# Patient Record
Sex: Female | Born: 1980 | Race: White | Hispanic: No | Marital: Single | Smoking: Current every day smoker
Health system: Southern US, Community
[De-identification: ages and names within clinical notes are randomized; demographics above are authoritative.]

---

## 2000-08-10 ENCOUNTER — Other Ambulatory Visit: Admission: RE | Admit: 2000-08-10 | Discharge: 2000-08-10 | Payer: Self-pay | Admitting: Obstetrics & Gynecology

## 2000-12-20 ENCOUNTER — Ambulatory Visit (HOSPITAL_COMMUNITY): Admission: RE | Admit: 2000-12-20 | Discharge: 2000-12-20 | Payer: Self-pay | Admitting: Obstetrics and Gynecology

## 2001-02-06 ENCOUNTER — Ambulatory Visit (HOSPITAL_COMMUNITY): Admission: RE | Admit: 2001-02-06 | Discharge: 2001-02-06 | Payer: Self-pay | Admitting: Obstetrics and Gynecology

## 2001-02-06 ENCOUNTER — Encounter: Payer: Self-pay | Admitting: Obstetrics and Gynecology

## 2001-02-07 ENCOUNTER — Encounter (INDEPENDENT_AMBULATORY_CARE_PROVIDER_SITE_OTHER): Payer: Self-pay | Admitting: Specialist

## 2001-02-07 ENCOUNTER — Inpatient Hospital Stay (HOSPITAL_COMMUNITY): Admission: AD | Admit: 2001-02-07 | Discharge: 2001-02-10 | Payer: Self-pay | Admitting: Obstetrics and Gynecology

## 2001-02-11 ENCOUNTER — Encounter: Admission: RE | Admit: 2001-02-11 | Discharge: 2001-03-13 | Payer: Self-pay | Admitting: Obstetrics and Gynecology

## 2001-03-14 ENCOUNTER — Encounter: Admission: RE | Admit: 2001-03-14 | Discharge: 2001-04-13 | Payer: Self-pay | Admitting: Obstetrics and Gynecology

## 2002-03-18 ENCOUNTER — Encounter: Payer: Self-pay | Admitting: Obstetrics & Gynecology

## 2002-03-18 ENCOUNTER — Inpatient Hospital Stay (HOSPITAL_COMMUNITY): Admission: AD | Admit: 2002-03-18 | Discharge: 2002-03-18 | Payer: Self-pay | Admitting: Obstetrics & Gynecology

## 2002-07-05 ENCOUNTER — Emergency Department (HOSPITAL_COMMUNITY): Admission: EM | Admit: 2002-07-05 | Discharge: 2002-07-05 | Payer: Self-pay | Admitting: Emergency Medicine

## 2002-07-05 ENCOUNTER — Encounter: Payer: Self-pay | Admitting: Emergency Medicine

## 2003-03-30 ENCOUNTER — Inpatient Hospital Stay (HOSPITAL_COMMUNITY): Admission: AD | Admit: 2003-03-30 | Discharge: 2003-03-30 | Payer: Self-pay | Admitting: Obstetrics and Gynecology

## 2003-09-06 ENCOUNTER — Inpatient Hospital Stay (HOSPITAL_COMMUNITY): Admission: AD | Admit: 2003-09-06 | Discharge: 2003-09-06 | Payer: Self-pay | Admitting: Family Medicine

## 2003-09-08 ENCOUNTER — Inpatient Hospital Stay (HOSPITAL_COMMUNITY): Admission: AD | Admit: 2003-09-08 | Discharge: 2003-09-08 | Payer: Self-pay | Admitting: Obstetrics & Gynecology

## 2003-09-15 ENCOUNTER — Inpatient Hospital Stay (HOSPITAL_COMMUNITY): Admission: AD | Admit: 2003-09-15 | Discharge: 2003-09-15 | Payer: Self-pay | Admitting: *Deleted

## 2004-01-10 ENCOUNTER — Emergency Department (HOSPITAL_COMMUNITY): Admission: EM | Admit: 2004-01-10 | Discharge: 2004-01-10 | Payer: Self-pay | Admitting: Emergency Medicine

## 2004-07-12 ENCOUNTER — Emergency Department (HOSPITAL_COMMUNITY): Admission: EM | Admit: 2004-07-12 | Discharge: 2004-07-12 | Payer: Self-pay | Admitting: Emergency Medicine

## 2004-12-11 ENCOUNTER — Inpatient Hospital Stay (HOSPITAL_COMMUNITY): Admission: AD | Admit: 2004-12-11 | Discharge: 2004-12-11 | Payer: Self-pay | Admitting: *Deleted

## 2004-12-21 ENCOUNTER — Inpatient Hospital Stay (HOSPITAL_COMMUNITY): Admission: AD | Admit: 2004-12-21 | Discharge: 2004-12-21 | Payer: Self-pay | Admitting: *Deleted

## 2005-01-12 ENCOUNTER — Ambulatory Visit: Payer: Self-pay | Admitting: Family Medicine

## 2005-02-03 ENCOUNTER — Ambulatory Visit: Payer: Self-pay | Admitting: Internal Medicine

## 2005-02-09 ENCOUNTER — Ambulatory Visit: Payer: Self-pay | Admitting: *Deleted

## 2005-02-10 ENCOUNTER — Ambulatory Visit: Payer: Self-pay | Admitting: *Deleted

## 2005-02-21 ENCOUNTER — Ambulatory Visit: Payer: Self-pay

## 2005-03-02 ENCOUNTER — Ambulatory Visit: Payer: Self-pay | Admitting: Family Medicine

## 2005-03-02 ENCOUNTER — Ambulatory Visit (HOSPITAL_COMMUNITY): Admission: RE | Admit: 2005-03-02 | Discharge: 2005-03-02 | Payer: Self-pay | Admitting: Orthopaedic Surgery

## 2005-03-08 ENCOUNTER — Ambulatory Visit: Payer: Self-pay | Admitting: *Deleted

## 2005-03-22 ENCOUNTER — Ambulatory Visit: Payer: Self-pay | Admitting: Obstetrics & Gynecology

## 2005-04-05 ENCOUNTER — Ambulatory Visit: Payer: Self-pay | Admitting: *Deleted

## 2005-04-19 ENCOUNTER — Ambulatory Visit: Payer: Self-pay | Admitting: Obstetrics & Gynecology

## 2005-05-03 ENCOUNTER — Ambulatory Visit: Payer: Self-pay | Admitting: Obstetrics & Gynecology

## 2005-05-10 ENCOUNTER — Ambulatory Visit: Payer: Self-pay | Admitting: Obstetrics & Gynecology

## 2005-05-10 ENCOUNTER — Inpatient Hospital Stay (HOSPITAL_COMMUNITY): Admission: AD | Admit: 2005-05-10 | Discharge: 2005-05-10 | Payer: Self-pay | Admitting: Obstetrics and Gynecology

## 2005-05-11 ENCOUNTER — Ambulatory Visit: Payer: Self-pay | Admitting: Obstetrics & Gynecology

## 2005-05-17 ENCOUNTER — Ambulatory Visit (HOSPITAL_COMMUNITY): Admission: RE | Admit: 2005-05-17 | Discharge: 2005-05-17 | Payer: Self-pay | Admitting: Obstetrics and Gynecology

## 2005-05-24 ENCOUNTER — Ambulatory Visit: Payer: Self-pay | Admitting: Obstetrics & Gynecology

## 2005-06-04 ENCOUNTER — Ambulatory Visit: Payer: Self-pay | Admitting: Certified Nurse Midwife

## 2005-06-04 ENCOUNTER — Inpatient Hospital Stay (HOSPITAL_COMMUNITY): Admission: AD | Admit: 2005-06-04 | Discharge: 2005-06-04 | Payer: Self-pay | Admitting: *Deleted

## 2005-06-07 ENCOUNTER — Ambulatory Visit: Payer: Self-pay | Admitting: Obstetrics & Gynecology

## 2005-06-14 ENCOUNTER — Ambulatory Visit: Payer: Self-pay | Admitting: Family Medicine

## 2005-06-14 ENCOUNTER — Ambulatory Visit (HOSPITAL_COMMUNITY): Admission: RE | Admit: 2005-06-14 | Discharge: 2005-06-14 | Payer: Self-pay | Admitting: *Deleted

## 2005-06-19 ENCOUNTER — Ambulatory Visit: Payer: Self-pay | Admitting: *Deleted

## 2005-06-22 ENCOUNTER — Ambulatory Visit: Payer: Self-pay | Admitting: *Deleted

## 2005-06-26 ENCOUNTER — Ambulatory Visit: Payer: Self-pay | Admitting: *Deleted

## 2005-06-29 ENCOUNTER — Ambulatory Visit: Payer: Self-pay | Admitting: *Deleted

## 2005-07-06 ENCOUNTER — Ambulatory Visit (HOSPITAL_COMMUNITY): Admission: RE | Admit: 2005-07-06 | Discharge: 2005-07-06 | Payer: Self-pay | Admitting: *Deleted

## 2005-07-06 ENCOUNTER — Ambulatory Visit: Payer: Self-pay | Admitting: Gynecology

## 2005-07-13 ENCOUNTER — Ambulatory Visit: Payer: Self-pay | Admitting: Gynecology

## 2005-07-20 ENCOUNTER — Ambulatory Visit: Payer: Self-pay | Admitting: Gynecology

## 2005-07-27 ENCOUNTER — Ambulatory Visit: Payer: Self-pay | Admitting: Gynecology

## 2005-07-31 ENCOUNTER — Ambulatory Visit: Payer: Self-pay | Admitting: Gynecology

## 2005-07-31 ENCOUNTER — Inpatient Hospital Stay (HOSPITAL_COMMUNITY): Admission: RE | Admit: 2005-07-31 | Discharge: 2005-08-03 | Payer: Self-pay | Admitting: Gynecology

## 2005-08-07 ENCOUNTER — Ambulatory Visit: Payer: Self-pay | Admitting: *Deleted

## 2005-09-01 ENCOUNTER — Emergency Department (HOSPITAL_COMMUNITY): Admission: EM | Admit: 2005-09-01 | Discharge: 2005-09-01 | Payer: Self-pay | Admitting: Emergency Medicine

## 2005-10-04 ENCOUNTER — Emergency Department (HOSPITAL_COMMUNITY): Admission: EM | Admit: 2005-10-04 | Discharge: 2005-10-04 | Payer: Self-pay | Admitting: Emergency Medicine

## 2005-10-06 ENCOUNTER — Emergency Department (HOSPITAL_COMMUNITY): Admission: EM | Admit: 2005-10-06 | Discharge: 2005-10-06 | Payer: Self-pay | Admitting: Family Medicine

## 2006-03-06 ENCOUNTER — Emergency Department (HOSPITAL_COMMUNITY): Admission: EM | Admit: 2006-03-06 | Discharge: 2006-03-06 | Payer: Self-pay | Admitting: Emergency Medicine

## 2006-06-07 ENCOUNTER — Emergency Department (HOSPITAL_COMMUNITY): Admission: EM | Admit: 2006-06-07 | Discharge: 2006-06-07 | Payer: Self-pay | Admitting: Emergency Medicine

## 2006-11-05 IMAGING — US US OB FOLLOW-UP
1 series · 13 of 28 positions shown · non-contrast
Comparison: none

CLINICAL DATA: Assess growth and fetal well-being.  Previous child with severe IUGR.

[Series 1: us ob follow-up · 0.33mm/px · 13 of 31 slices shown]
[im 2/31]
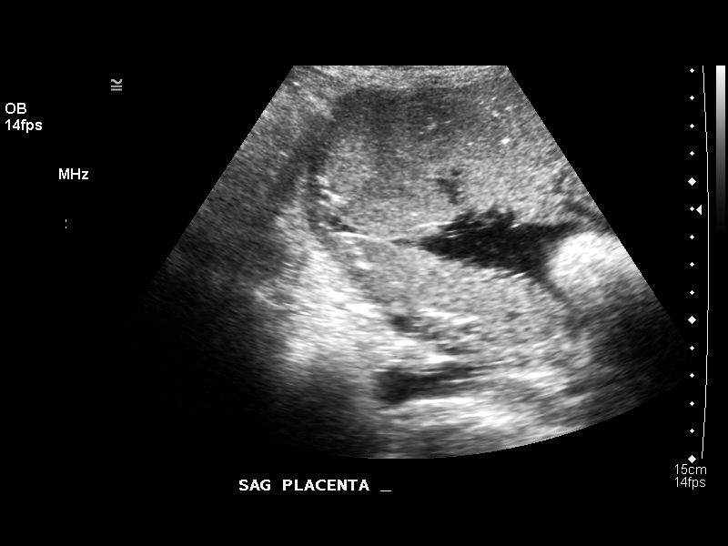
[im 4/31]
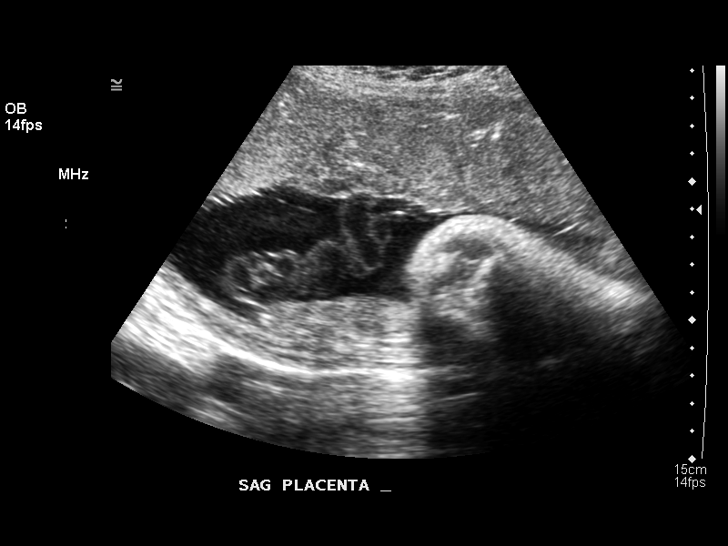
[im 6/31]
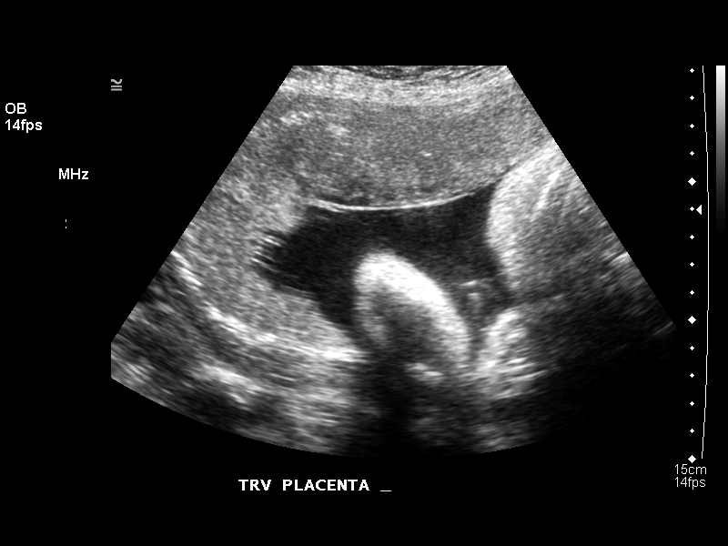
[im 8/31]
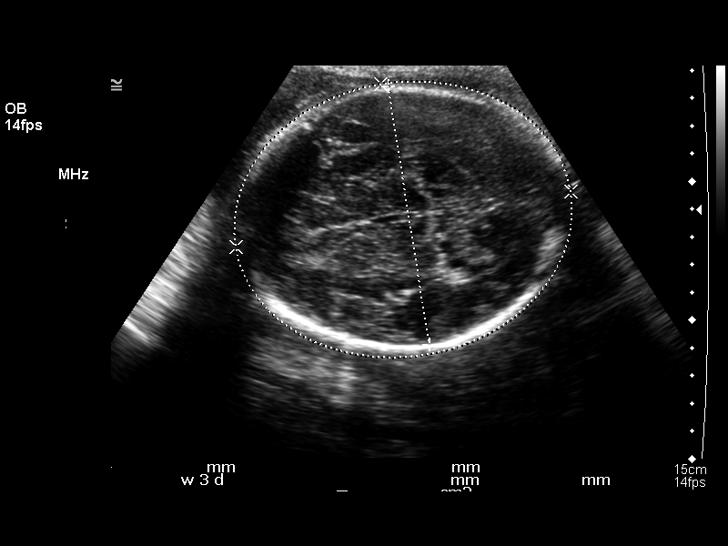
[im 11/31]
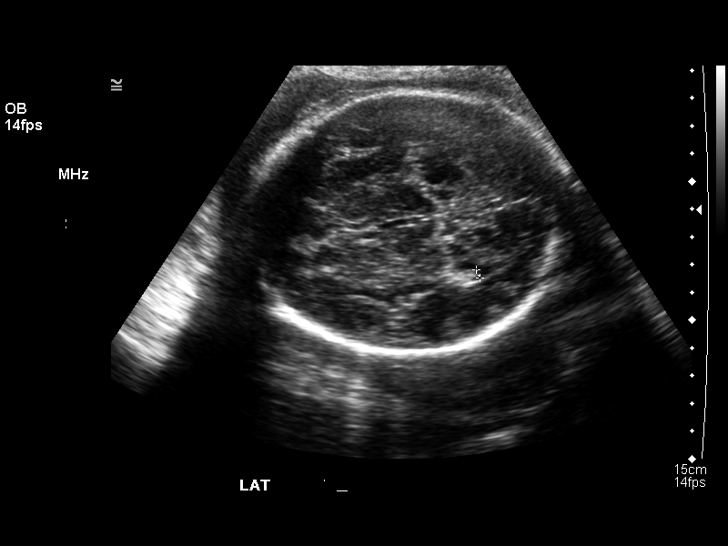
[im 13/31]
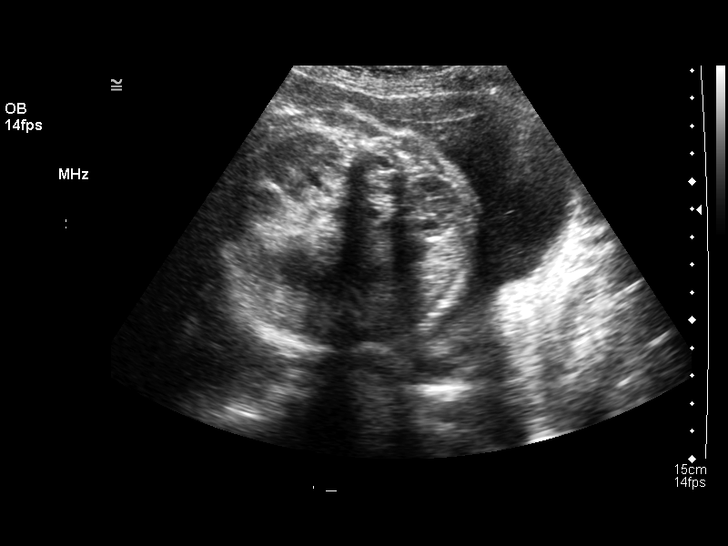
[im 16/31]
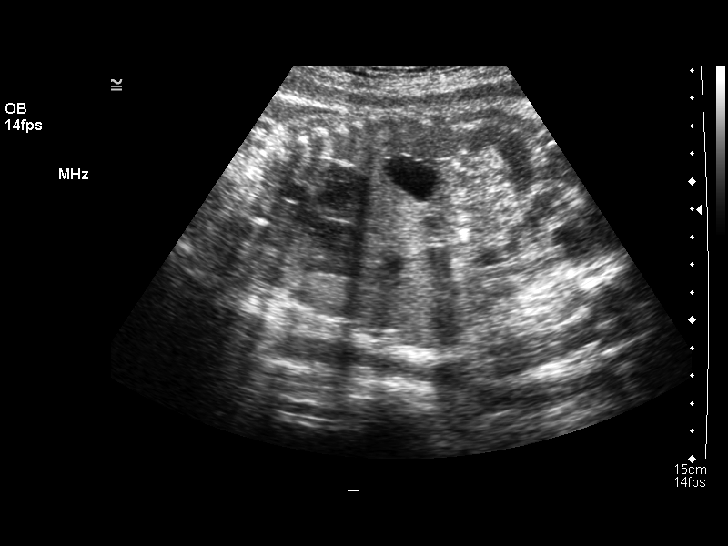
[im 18/31]
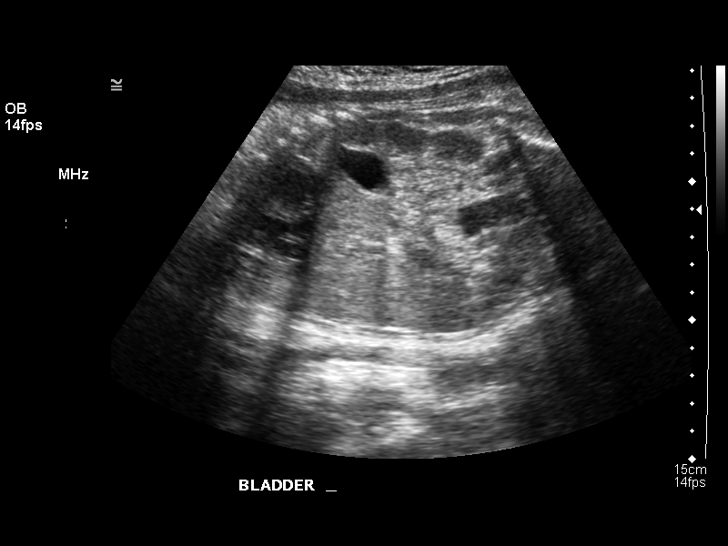
[im 21/31]
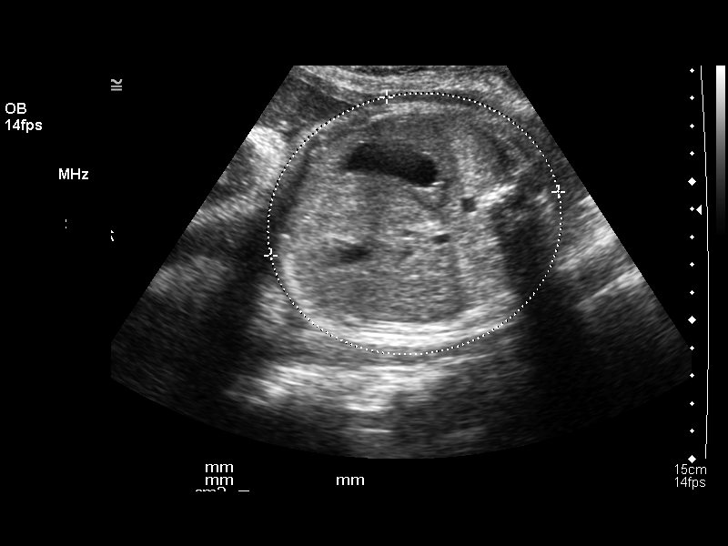
[im 23/31]
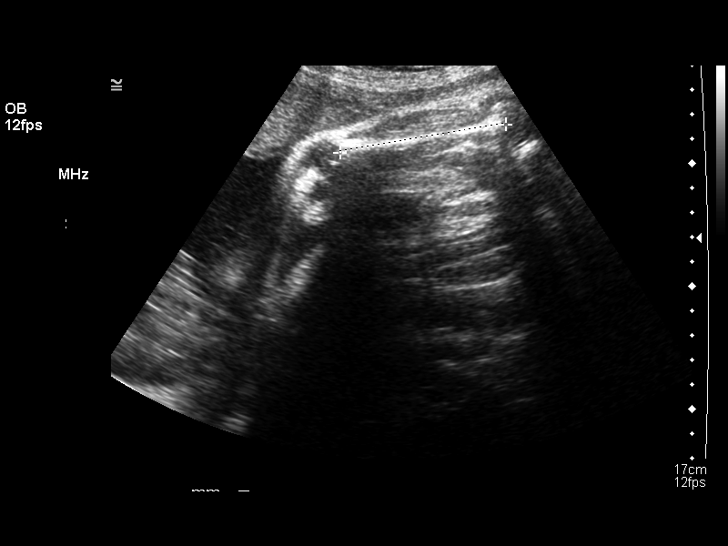
[im 25/31]
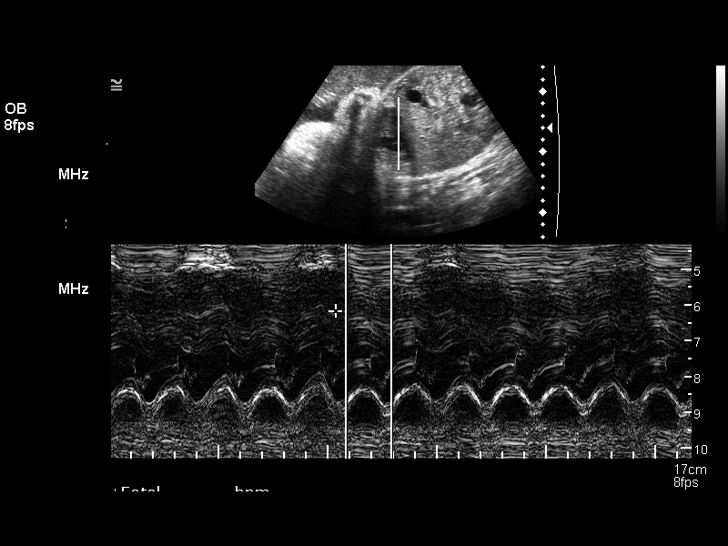
[im 27/31]
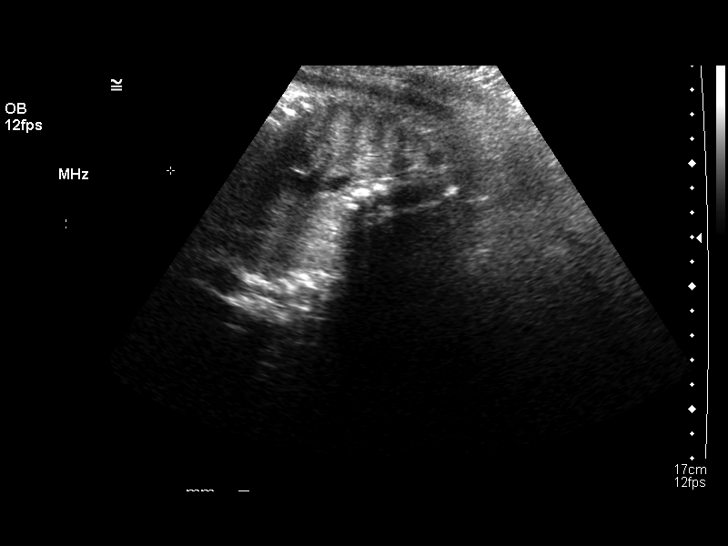
[im 29/31]
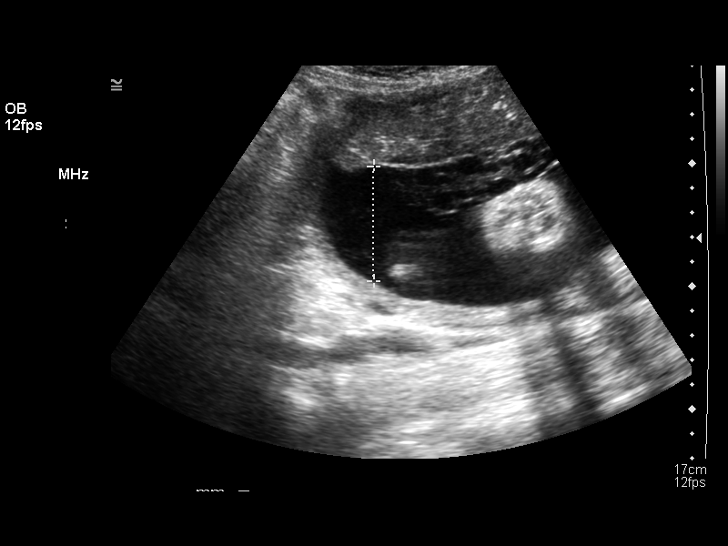

[13 of 28 positions shown; findings below may reference images not displayed]

OBSTETRICAL ULTRASOUND RE-EVALUATION:
Number of Fetuses:  1
Heart Rate:  144
Movement:  Yes
Breathing:  Yes
Presentation:  Breech
Placental Location:  Fundal, anterior, and right lateral 
Grade:  II
Previa:  No
Amniotic Fluid (subjective):  Normal
Amniotic Fluid (objective):  16.0 cm AFI (5th -95th%ile = 7.7 ? 24.9 cm for 36 wks)

FETAL BIOMETRY
BPD:  9.5 cm  38 w 6 d
HC:  34.7 cm  40 w 2 d
AC:  31.9 cm   35 w 6 d
FL:  6.9 cm  35 w 2 d

Mean GA:  37 w 4 d  
Assigned GA:  36 w 1 d  Assigned EDC:  08/02/05
Fetal indices are within normal limits.
EFW:  6900 g (H) 75th ? 90th%ile (2125 ? 3835 g) For 36 wks

FETAL ANATOMY
Lateral Ventricles:  Visualized 
Thalami/CSP:  Visualized 
Posterior Fossa:  Previously seen 
Nuchal Region:  Previously seen 
Spine:  Previously seen 
4 Chamber Heart on Left:  Previously seen 
Stomach on Left:  Visualized 
3 Vessel Cord:  Previously seen 
Cord Insertion Site:  Previously seen 
Kidneys:  Visualized 
Bladder:  Visualized 
Extremities:  Previously seen 

ADDITIONAL ANATOMY VISUALIZED:  Diaphragm and male genitalia.

Evaluation limited by:  Advanced gestational age.

MATERNAL UTERINE AND ADNEXAL FINDINGS
Cervix:  Not evaluated 

BIOPHYSICAL PROFILE

Movement: 2    Time:  15 minutes
Breathing:  2
Tone:  2
Amniotic Fluid:  2

Total Score:  8
IMPRESSION: 1.  Single intrauterine pregnancy demonstrating an estimated gestational age by ultrasound of 37 weeks and 4 days.  Correlation with assigned gestational age of 36 weeks and 1 day signifies appropriate interval growth.  The estimated fetal weight is just above the 75th percentile for a 36 week gestation.  On the previous exam it was between the 50th and 75th percentile for a 33 week gestation correlating with appropriate interval growth.  
2.  Subjectively and quantitatively normal amniotic fluid volume.  
3.  Biophysical profile score [DATE].  
4.  No late developing fetal anatomic abnormalities are identified associated with the lateral ventricles, stomach, kidneys, or bladder.  A four chamber heart view could not be reassessed due to positioning on today?s exam.  

5.  The patient was sent with a preliminary copy of today?s results to a clinic visit immediately following this exam.

## 2008-04-13 ENCOUNTER — Emergency Department (HOSPITAL_COMMUNITY): Admission: EM | Admit: 2008-04-13 | Discharge: 2008-04-13 | Payer: Self-pay | Admitting: Emergency Medicine

## 2008-04-17 ENCOUNTER — Emergency Department (HOSPITAL_COMMUNITY): Admission: EM | Admit: 2008-04-17 | Discharge: 2008-04-17 | Payer: Self-pay | Admitting: Emergency Medicine

## 2008-12-20 ENCOUNTER — Emergency Department (HOSPITAL_COMMUNITY): Admission: EM | Admit: 2008-12-20 | Discharge: 2008-12-20 | Payer: Self-pay | Admitting: Emergency Medicine

## 2009-06-09 ENCOUNTER — Emergency Department (HOSPITAL_COMMUNITY): Admission: EM | Admit: 2009-06-09 | Discharge: 2009-06-09 | Payer: Self-pay | Admitting: Emergency Medicine

## 2009-12-17 ENCOUNTER — Ambulatory Visit: Payer: Self-pay | Admitting: Advanced Practice Midwife

## 2009-12-17 ENCOUNTER — Inpatient Hospital Stay (HOSPITAL_COMMUNITY): Admission: AD | Admit: 2009-12-17 | Discharge: 2009-12-17 | Payer: Self-pay | Admitting: Obstetrics & Gynecology

## 2010-01-05 ENCOUNTER — Emergency Department (HOSPITAL_COMMUNITY): Admission: EM | Admit: 2010-01-05 | Discharge: 2010-01-05 | Payer: Self-pay | Admitting: Emergency Medicine

## 2010-01-19 ENCOUNTER — Ambulatory Visit (HOSPITAL_COMMUNITY): Admission: RE | Admit: 2010-01-19 | Discharge: 2010-01-19 | Payer: Self-pay | Admitting: Family Medicine

## 2010-03-16 ENCOUNTER — Inpatient Hospital Stay (HOSPITAL_COMMUNITY)
Admission: RE | Admit: 2010-03-16 | Discharge: 2010-03-18 | Payer: Self-pay | Source: Home / Self Care | Admitting: Family Medicine

## 2010-06-27 LAB — RH IMMUNE GLOB WKUP(>/=20WKS)(NOT WOMEN'S HOSP)
Fetal Screen: NEGATIVE
Unit division: 0

## 2010-06-27 LAB — CBC
MCH: 29.9 pg (ref 26.0–34.0)
Platelets: 204 10*3/uL (ref 150–400)
RDW: 13.1 % (ref 11.5–15.5)

## 2010-06-28 LAB — CBC
HCT: 33.7 % — ABNORMAL LOW (ref 36.0–46.0)
Hemoglobin: 11.6 g/dL — ABNORMAL LOW (ref 12.0–15.0)
RBC: 3.9 MIL/uL (ref 3.87–5.11)
WBC: 16.1 10*3/uL — ABNORMAL HIGH (ref 4.0–10.5)

## 2010-06-28 LAB — SURGICAL PCR SCREEN: MRSA, PCR: NEGATIVE

## 2010-06-28 LAB — TYPE AND SCREEN

## 2010-06-30 LAB — URINALYSIS, ROUTINE W REFLEX MICROSCOPIC
Glucose, UA: NEGATIVE mg/dL
Hgb urine dipstick: NEGATIVE
Ketones, ur: NEGATIVE mg/dL
Specific Gravity, Urine: 1.015 (ref 1.005–1.030)
Urobilinogen, UA: 1 mg/dL (ref 0.0–1.0)
pH: 7 (ref 5.0–8.0)

## 2010-06-30 LAB — WET PREP, GENITAL
Clue Cells Wet Prep HPF POC: NONE SEEN
Trich, Wet Prep: NONE SEEN

## 2010-06-30 LAB — GC/CHLAMYDIA PROBE AMP, GENITAL: GC Probe Amp, Genital: NEGATIVE

## 2010-06-30 LAB — URINE CULTURE: Colony Count: 40000

## 2010-06-30 LAB — RAPID URINE DRUG SCREEN, HOSP PERFORMED: Opiates: NOT DETECTED

## 2010-06-30 LAB — URINE MICROSCOPIC-ADD ON

## 2010-09-02 NOTE — Op Note (Signed)
Angela Vega, Angela Vega          ACCOUNT NO.:  [id number]   MEDICAL RECORD NO.:  [id number]          PATIENT TYPE:  INP   LOCATION:  WOC                           [facility]ILITY:  [facility]   PHYSICIAN:  Ginger Carne, MD  DATE OF BIRTH:  [birth year]   DATE OF PROCEDURE:  07/31/2005  DATE OF DISCHARGE:                                 OPERATIVE REPORT   PREOPERATIVE DIAGNOSIS:  Repeat cesarean section, term pregnancy.   POSTOP DIAGNOSIS:  Repeat cesarean section, term pregnancy, term viable  delivery of female infant.   PROCEDURE:  Repeat low transverse cesarean section.   SURGEON:  Ginger Carne, MD   ASSISTANT:  Dr. Merlene Morse.   ESTIMATED BLOOD LOSS:  500 mL.   COMPLICATIONS:  None immediate.   ANESTHESIA:  Spinal.   SPECIMEN:  Cord bloods.   OPERATIVE FINDINGS:  Term infant female delivered in vertex presentation on  07/31/2005 Apgar and weight per delivery room record, no gross abnormality,  baby cried spontaneously at delivery.  Apgar and weight per delivery record  fluid was clear.  Three-vessel umbilical cord, central insertion complete  placenta.   OPERATIVE PROCEDURE:  The patient prepped and draped in the usual fashion  and placed in left lateral supine position.  Betadine solution used for  antiseptic and the patient was catheterized prior to procedure.  After  adequate spinal analgesia, Pfannenstiel incision was made.  The abdomen  opened, lower uterine segment incised transversely.  Baby delivered, cord  clamped, cut and infant given to the pediatric staff after bulb suctioning.  Placenta removed manually.  Uterus inspected.  Closure of the fascia in one  layer with 0 Vicryl running suture, 3-0 Vicryl subcutaneous layer and skin  staples for the skin.  Instrument and sponge count were correct.  The  patient tolerated procedure well.  Returned to the post anesthesia recovery  room in excellent condition.      Ginger Carne, MD  Electronically Signed     SHB/MEDQ  D:  07/31/2005  T:  08/01/2005  Job:  [id number]

## 2010-09-02 NOTE — Discharge Summary (Signed)
[facility]  Patient:    XIAO, GRAUL Visit Number: [id number] MRN: [id number]          Service Type: OBS Location: 9300 [id number] Attending Physician:  Miguel Aschoff Dictated by:   Janeece Riggers Dareen Piano, M.D. Admit Date:  02/07/2001 Discharge Date: 02/10/2001                             Discharge Summary  DISCHARGE DIAGNOSES:          1. Intrauterine pregnancy at 35 weeks                                  estimated gestational age.                               2. Intrauterine growth retardation.                               3. Abnormal systolic-diastolic ratio.                               4. Breech presentation.  SPECIAL PROCEDURE:            Primary low transverse cesarean section.  HISTORY OF PRESENT ILLNESS:   The patient is a 30 year old white female G1, P0, with an EDC of March 15, 2001 at 35 weeks estimated gestational age. The patient was noted to have fundal height measurement which was significantly small, therefore, she underwent an ultrasound and was noted to have intrauterine growth retardation with estimated fetal weight below the third percentile.  In addition, there was abnormal uterine artery S/D ratio. It was felt that because of these findings the patient should undergo delivery.  At the time of ultrasound it was also noted that she was in the breech presentation and therefore, a primary low transverse cesarean section was performed by Dr. Tenny Craw.  A complete description of this procedure can be found in dictated operative note.  HOSPITAL COURSE:              The patients postoperative course was uncomplicated and she was discharged to home on postoperative day #3.  At the time of discharge the patient was eating a regular diet and ambulating without difficulty, the incision appeared to be healing well, the baby was in the neonatal intensive care unit and doing well.  The patients preoperative hemoglobin was 14.2 and  postoperative 12.5.  The patient was sent home with Tylox and instructed to follow up in the office in four weeks. Dictated by:   Janeece Riggers Dareen Piano, M.D. Attending Physician:  Miguel Aschoff DD:  02/22/01 TD:  02/23/01 Job: [id number]

## 2010-09-02 NOTE — Op Note (Signed)
[facility]  Patient:    Angela Vega, SORTER Visit Number: [id number] MRN: [id number]          Service Type: OBS Location: 9300 [id number] Attending Physician:  Miguel Aschoff Dictated by:   Miguel Aschoff, M.D. Proc. Date: 02/07/01 Admit Date:  02/07/2001                             Operative Report  PREOPERATIVE DIAGNOSES:       Intrauterine pregnancy at 35 weeks with intrauterine growth restriction, abnormal S:D ratio, breech presentation.  POSTOPERATIVE DIAGNOSES:      Intrauterine pregnancy at 35 weeks with intrauterine growth restriction, abnormal S:D ratio, breech presentation.  FINDINGS:                     Delivery of viable female infant; Apgars 8 and 9, weighing 1257 g.  PROCEDURE:                    Primary low transverse cesarean section.  SURGEON:                      Miguel Aschoff, M.D.  ANESTHESIA:                   Spinal.  COMPLICATIONS:                None.  JUSTIFICATION:                The patient is a 30 year old white female, gravida 1, para 0, with estimated date of confinement of March 15, 2001. On clinical examination the patient was noted to have a remarkably small uterine fundus.  She was sent for ultrasound for fetal growth parameters, and the fetus was noted to fall below the third percentile in all measurements. In addition, there was an abnormal uterine artery S:D ratio.  Because of the growth restriction, abnormal S:D ratio it was felt that the fetus should be delivered.  On examination by ultrasound she was noted to be breech, and it was elected to proceed with primary low transverse cesarean section.  DESCRIPTION OF PROCEDURE:     The patient was taken to the operating room and placed in the sitting position.  The spinal anesthesia was administered without difficulty.  She was then placed in the supine position, prepped and draped in the usual sterile fashion.  A Foley catheter was inserted.  A Pfannenstiel  incision was then made and carried down through the subcutaneous tissue, with bleeding points being clamped and coagulated as they were encountered.  The fascia was identified and incised transversely.  It was then separated from the underlying rectus muscles.  The rectus muscles were divided in the midline.  The peritoneum was then identified and entered, careful to avoid underlying structures.  Bladder flap was then created and protected with a bladder blade.  An elliptical transverse incision was made into the lower uterine segment; amniotic cavity was entered and clear fluid was obtained.  At this point the patient was delivered of a viable female infant; Apgar 8 at one minute and 9 at five minutes, from a frank breech position LSA.  Cord ph was obtained (7.32).  The infant was weighed (1257 g).  The infant was handed to neonatal team in attendance.  The placenta was then removed, after cord pH had been obtained.  Placenta was then sent for  histologic study.  The uterus was then evacuated of any remaining products of conception, and then uterus was closed.  The uterus was closed using running, interlocking 0 Vicryl suture. Then a second layer of interrupted 0 Vicryl suture was used.  The bladder flap was reapproximated using running continuous 2-0 Vicryl suture.  Tubes and ovaries were inspected, and were noted to be within normal limits.  Lap counts were taken and found to be correct.  The abdomen was closed.  The parietoperitoneum was then closed using running continuous 0 Vicryl suture. Rectus muscles were reapproximated using running continuous 0 Vicryl suture. The fascia was then closed using two sutures of 0 Vicryl, each starting at lateral fascial angles and meeting in the midline.  Subcutaneous tissue and skin were closed using staples.  The estimated blood loss was approximately 600 cc.  The patient tolerated the procedure well and went to the recovery room in satisfactory  condition. Dictated by:   Miguel Aschoff, M.D. Attending Physician:  Miguel Aschoff DD:  02/07/01 TD:  02/08/01 Job: [id number]

## 2010-09-02 NOTE — Discharge Summary (Signed)
Angela Vega, Angela Vega          ACCOUNT NO.:  [id number]   MEDICAL RECORD NO.:  [id number]          PATIENT TYPE:  WOC   LOCATION:  WOC                          FACILITY:  [facility]   PHYSICIAN:  Alanson Puls, M.D.    DATE OF BIRTH:  [birth year]   DATE OF ADMISSION:  07/27/2005  DATE OF DISCHARGE:  08/03/2005                                 DISCHARGE SUMMARY   ADMISSION DIAGNOSES:  Scheduled cesarean section.   DISCHARGE DIAGNOSIS:  Term pregnancy delivered at 13 and 3 via repeat  elective cesarean section on July 31, 2005.   HOSPITAL COURSE:  On July 31, 2005, this 30 year old G14, now P1122 at 84  and 3 underwent an elective repeat C-section and delivered a viable female  in  vertex position with Apgar of 8 and 9 at 1447.  The infant did deliver in a  vertex position. The cord was clamped and cut and three-vessel placenta  delivered at 1448.  Estimated blood loss was calculated to be about 600 mL.  There were no complications. Prior to discharge, the patient was afebrile  and temperature 98.4, heart rate 87, blood pressure 111-126 over 71-86.   PERTINENT DISCHARGE LAB WORK:  Mother's blood type A negative in which she  did receive RhoGAM. Postpartum hemoglobin 10.1, hematocrit 29.1 on August 01, 2005.  The patient is Rubella immune.   FOLLOW UP:  The patient was instructed to follow up in four weeks at [facility] for postpartum check.  She will follow up on Monday, April 23, at the  [facility] for staple removal at 2 p.m.   DISCHARGE MEDICATIONS:  1.  Ibuprofen 600 mg 1 tablet q.6h. p.r.n. cramping.  2.  Percocet 5/325 1 p.o. q.6h. p.r.n. pain.  3.  Depo-Provera 150 mg injection times one prior to discharge.  4.  The patient will continue prenatal vitamin daily while breast-feeding as      well as Colace 100 mg p.o. b.i.d. postpartum constipation.   DISCHARGE INSTRUCTIONS:  She instructed to have no heavy lifting over 20  pounds for the next two weeks  and to have nothing in the vagina for the next  six weeks.   She had a female infant weighing 6 pounds 7 ounces, length 18 and 3/4 inches  which is equal to 2945 grams.      Alanson Puls, M.D.    ______________________________  Alanson Puls, M.D.    MR/MEDQ  D:  08/03/2005  T:  08/03/2005  Job:  [id number]

## 2010-09-02 NOTE — H&P (Signed)
Angela Vega, Angela Vega          ACCOUNT NO.:  [id number]   MEDICAL RECORD NO.:  [id number]          PATIENT TYPE:  INP   LOCATION:  NA                            [facility]ACILITY[facility]  [facility]   PHYSICIAN:  Ginger Carne, MD  DATE OF BIRTH:  May 17, 1980   DATE OF ADMISSION:  07/27/2005  DATE OF DISCHARGE:                                HISTORY & PHYSICAL   REASON FOR HOSPITALIZATION:  Term pregnancy, repeat cesarean section by  patient request.   IN-HOSPITAL PROCEDURES:  Repeat low transverse cesarean section.   HISTORY OF PRESENT ILLNESS:  This patient is a 30 year old gravida 4, para 0-  1-2-1, female, [facility] August 03, 2005, by an LMP of October 26, 2004, confirmed by  first and second trimester ultrasound.  On the patient's date of surgery she  will be 39-4/7 weeks' gestation.  The patient's antenatal course has been  uneventful.  She was, however, treated for Chlamydia in the first trimester  with subsequent negative cultures.  During her first trimester ultrasound  there was a question about a septum noted in the intrauterine cavity;  however, a firm diagnosis of a bicornuate versus septate uterus could not be  confirmed.  The patient is A negative, group B strep negative at 36 weeks'  gestation, including negative Chlamydia and gonorrhea culture.  At the time  of this dictation her rubella titer could not be discerned from her chart.  She is hepatitis screen negative as well.   PAST OBSTETRICAL/GYNECOLOGIC HISTORY:  The patient in 2002 had a 34-week  primary cesarean section with intrauterine growth retardation noted.  She  has had one spontaneous and one elective termination of pregnancy, 2003 and  2005.   PAST SURGICAL HISTORY:  None.   MEDICAL HISTORY:  Noncontributory.   CURRENT MEDICATIONS:  Prenatal vitamins.   ALLERGIES:  None to medication, latex or iodine.   SOCIAL HISTORY:  The patient is a smoker, less than half a pack of  cigarettes a day, but denies alcohol or illicit  drug abuse.   FAMILY HISTORY:  Her sister was noted to have a spina bifida.   REVIEW OF SYSTEMS:  A 10-point comprehensive review of systems is  unremarkable.   PHYSICAL EXAMINATION:  VITAL SIGNS:  Blood pressure is 129/88, weight 181  pounds.  HEENT:  Grossly normal.  BREASTS:  Without masses, discharge, thickenings or tenderness.  CHEST:  Clear to percussion and auscultation.  CARDIOVASCULAR:  A grade 1/6 systolic ejection murmur in the left sternal  border.  LUNGS:  Clear to percussion and auscultation.  Lymphatic, extremity, neurological, skin systems normal.  ABDOMEN:  Soft without gross hepatosplenomegaly.  Term pregnancy noted.  PELVIC:  External genitalia, vulva and vagina normal.  Cervix smooth without  erosions or lesions.  Uterus is full-term.  Both adnexa are difficult to  palpate.   IMPRESSION:  Patient desire for repeat cesarean section.   PLAN:  Same.  The patient was apprised as to the risks and nature of a  repeat cesarean section.  She was offered the opportunity for a vaginal  birth after cesarean section and subsequently declined.  Ginger Carne, MD  Electronically Signed     SHB/MEDQ  D:  07/27/2005  T:  07/27/2005  Job:  [id number]

## 2011-01-20 LAB — RAPID STREP SCREEN (MED CTR MEBANE ONLY): Streptococcus, Group A Screen (Direct): NEGATIVE

## 2011-04-18 IMAGING — US US OB COMP +14 WK
1 series · 14 of 28 positions shown · non-contrast
Comparison: none

OBSTETRICAL ULTRASOUND:
 This ultrasound exam was performed in the [HOSPITAL] Ultrasound Department.  The OB US report was generated in the AS system, and faxed to the ordering physician.  This report is also available in [HOSPITAL]?s [employer] and in [REDACTED] PACS.

[Series 1: us ob comp +14 wk · 14 of 76 slices shown]
[im 3/76]
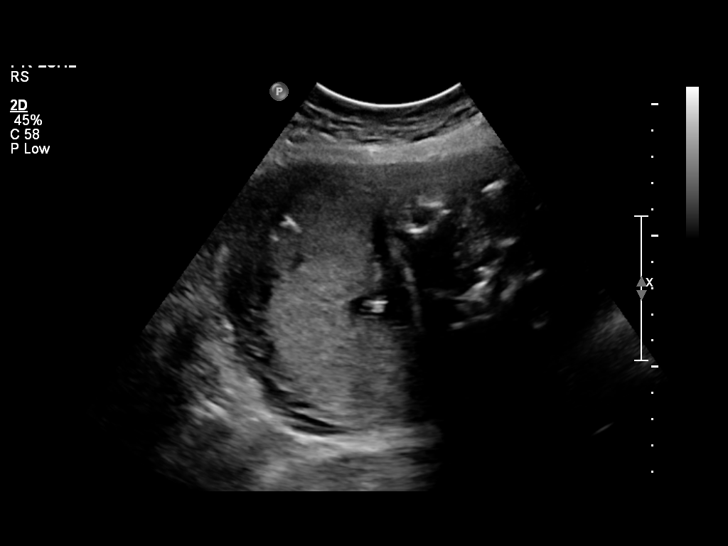
[im 9/76]
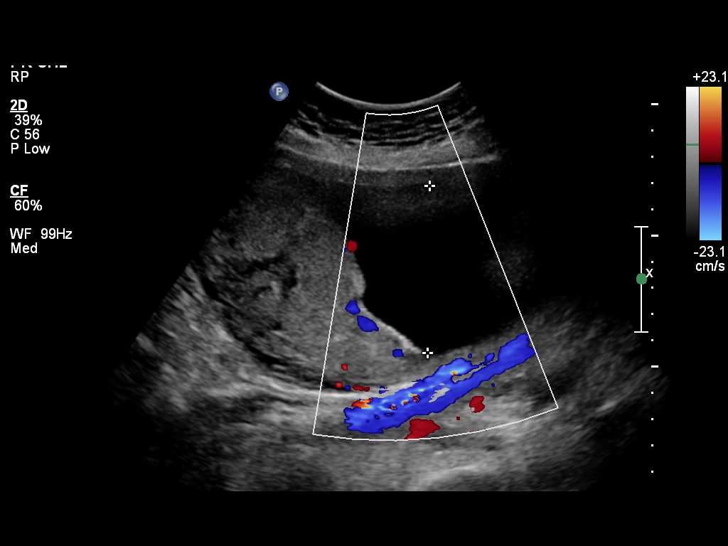
[im 14/76]
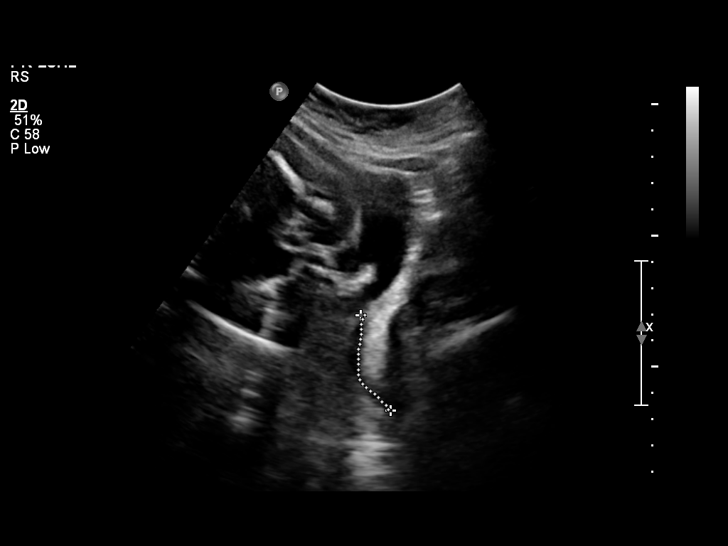
[im 20/76]
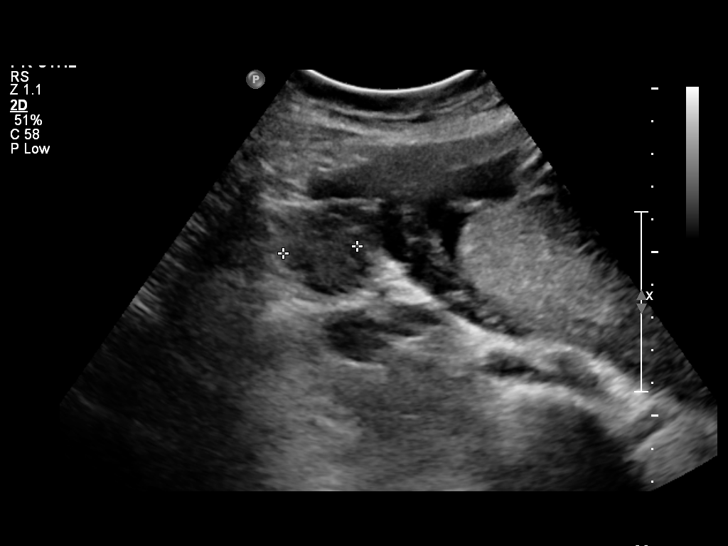
[im 26/76]
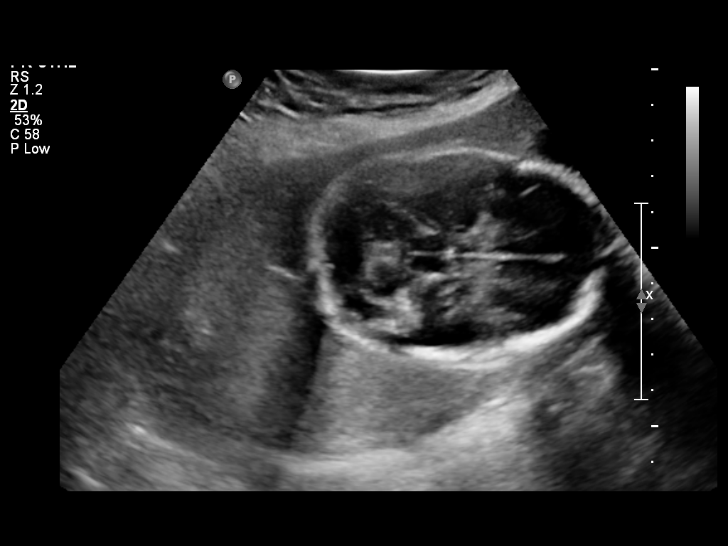
[im 31/76]
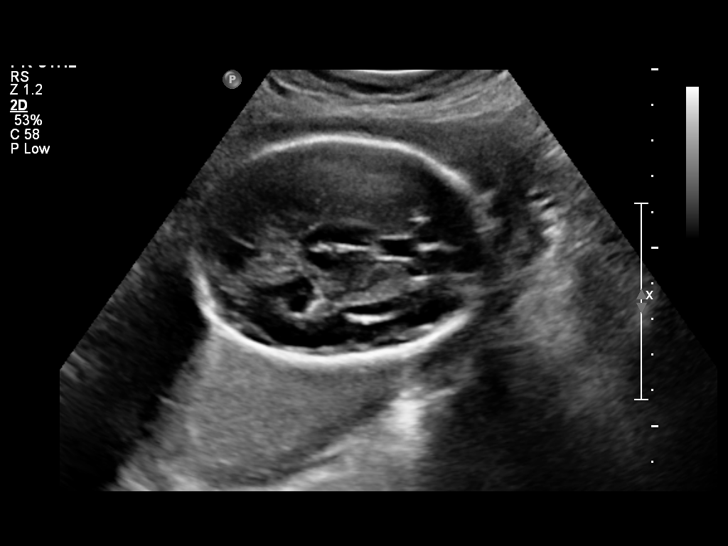
[im 37/76]
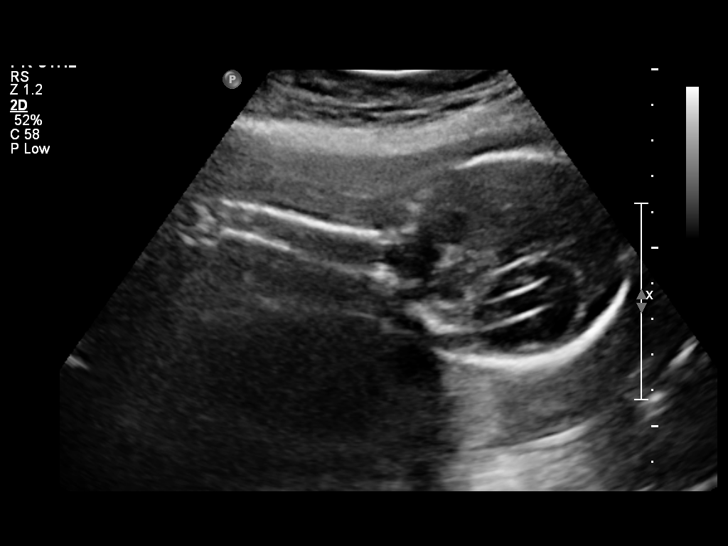
[im 42/76]
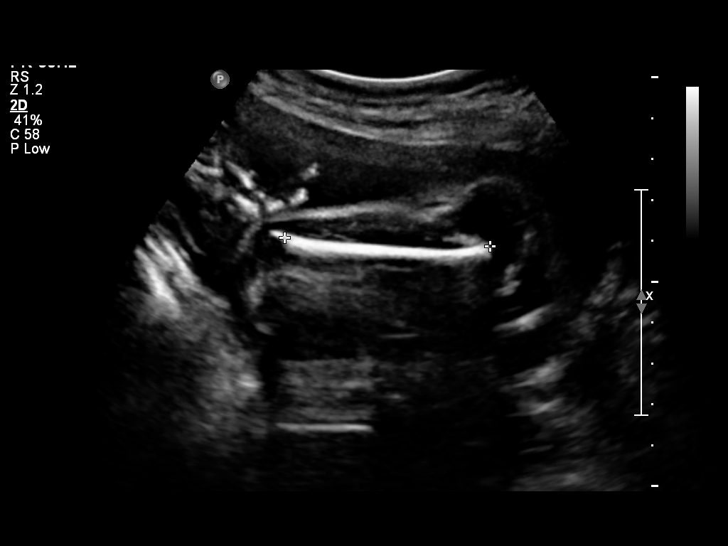
[im 48/76]
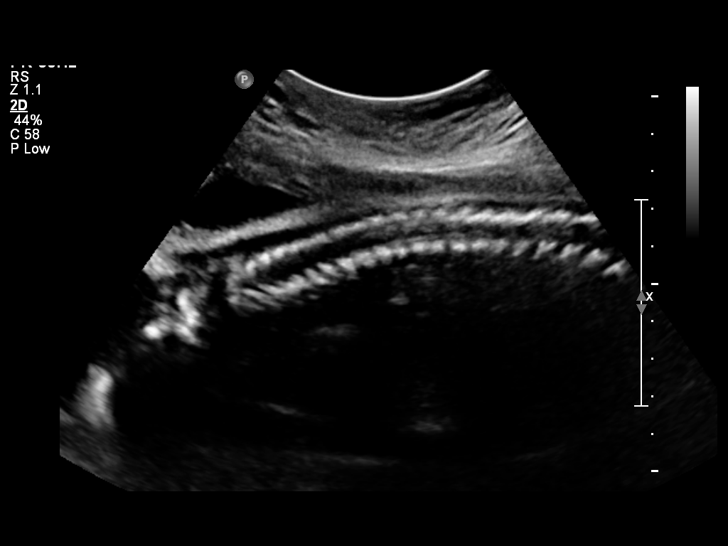
[im 53/76]
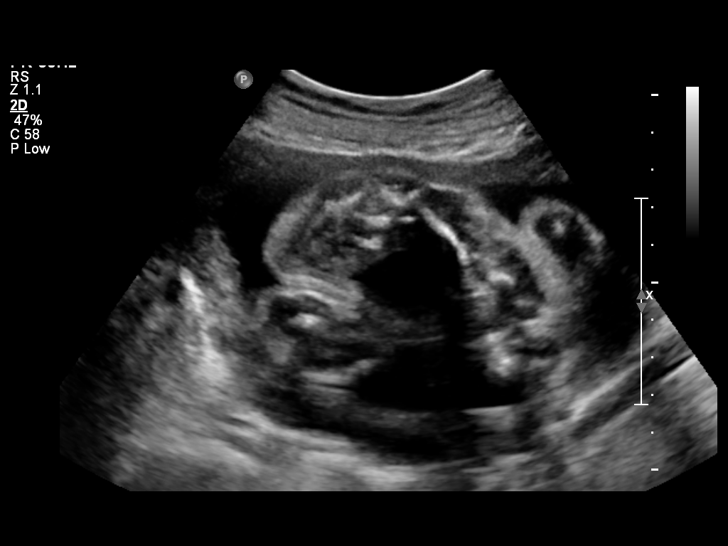
[im 59/76]
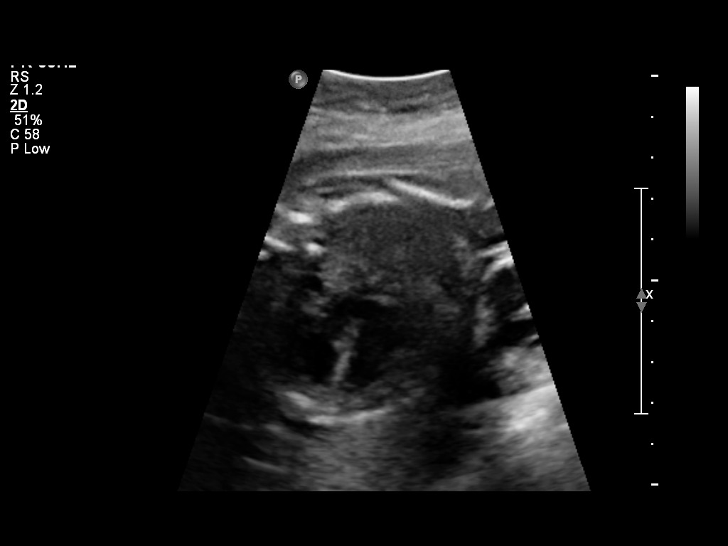
[im 64/76]
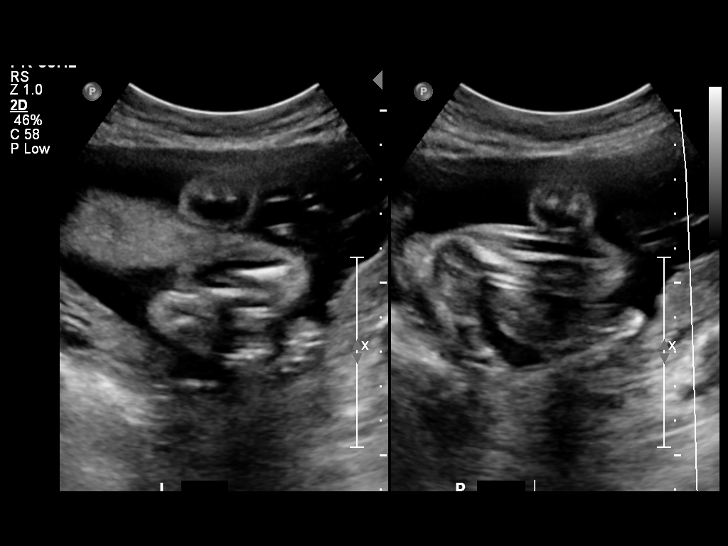
[im 70/76]
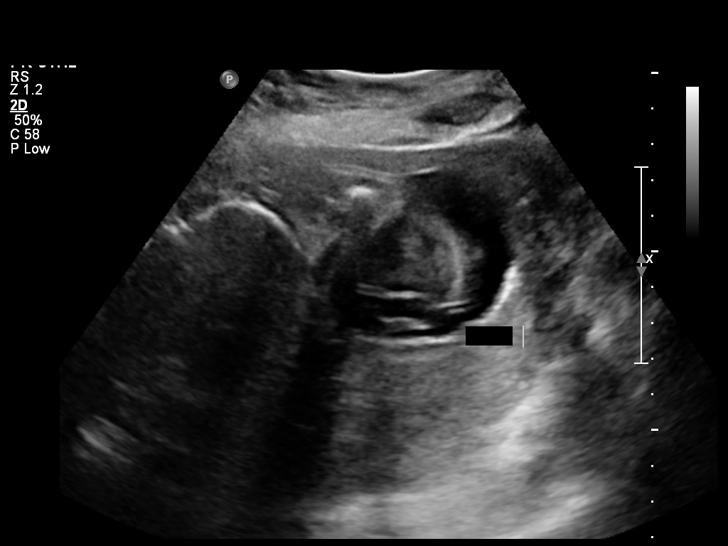
[im 76/76]
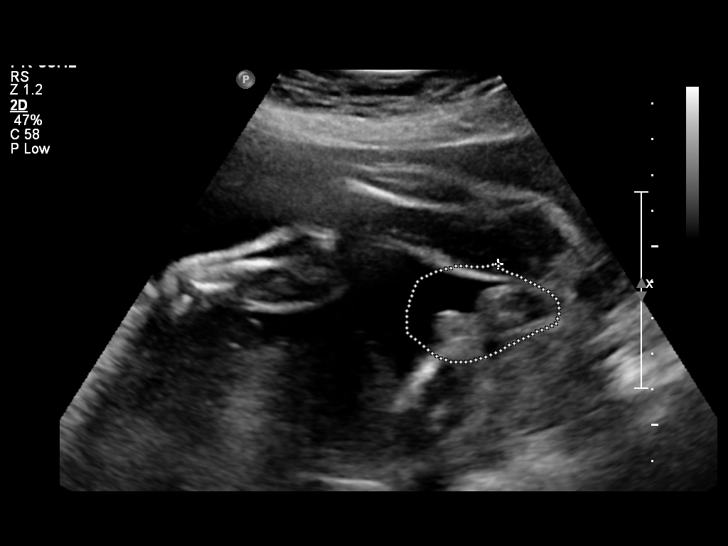

[14 of 28 positions shown; findings below may reference images not displayed]

IMPRESSION: See AS Obstetric US report.

## 2011-05-21 IMAGING — US US OB FOLLOW-UP
1 series · 14 of 28 positions shown · non-contrast
Comparison: none

OBSTETRICAL ULTRASOUND:
 This ultrasound exam was performed in the [HOSPITAL] Ultrasound Department.  The OB US report was generated in the AS system, and faxed to the ordering physician.  This report is also available in [HOSPITAL]?s [employer] and in [REDACTED] PACS.

[Series 1: us ob follow up · 43 acquisitions, 14 frames shown]
[im 2/43]
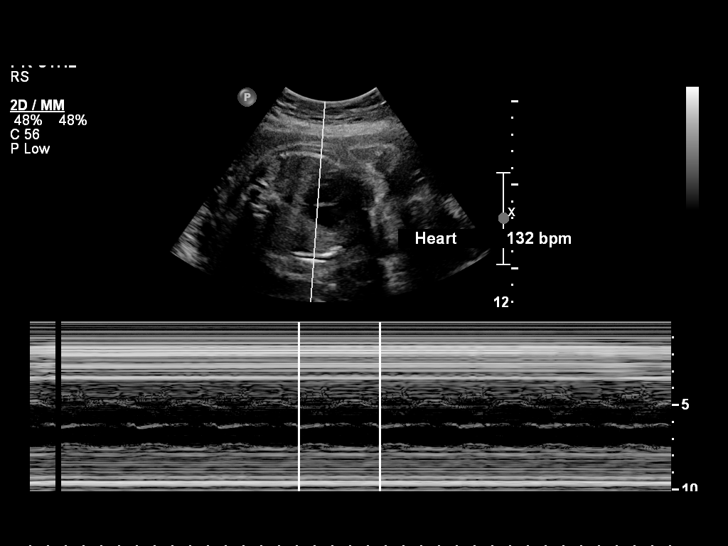
[im 5/43]
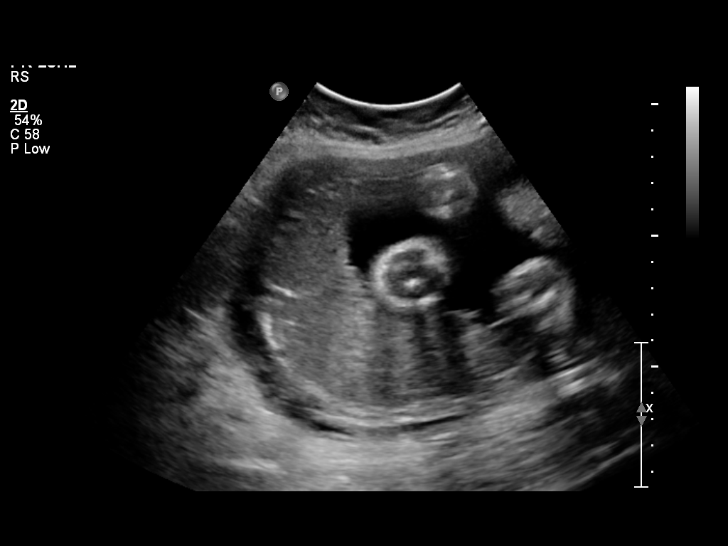
[im 8/43]
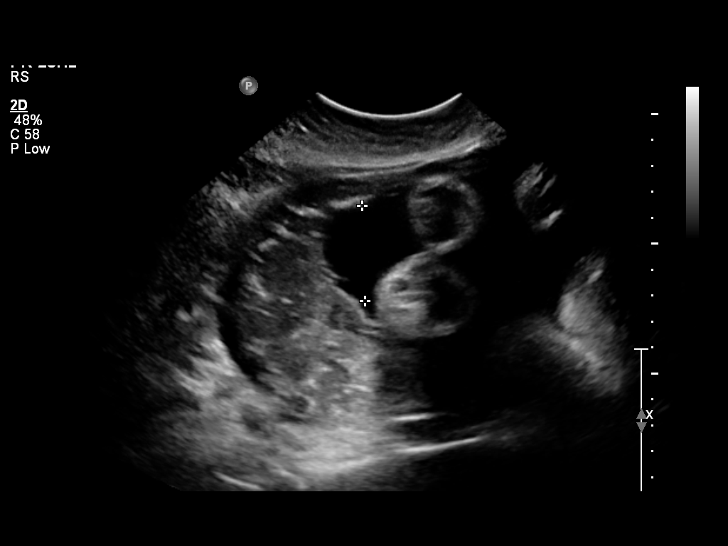
[im 11/43]
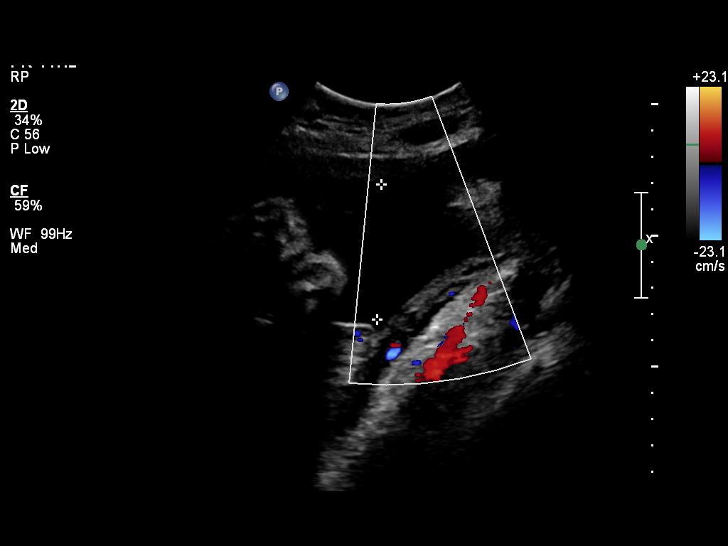
[im 15/43]
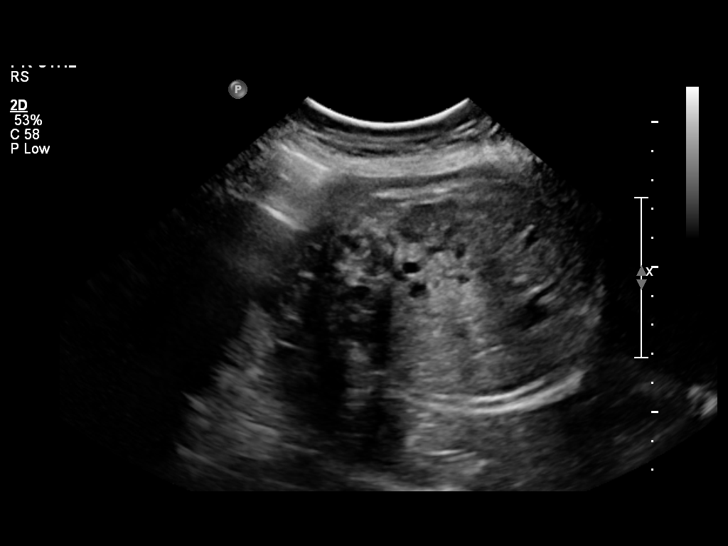
[im 18/43]
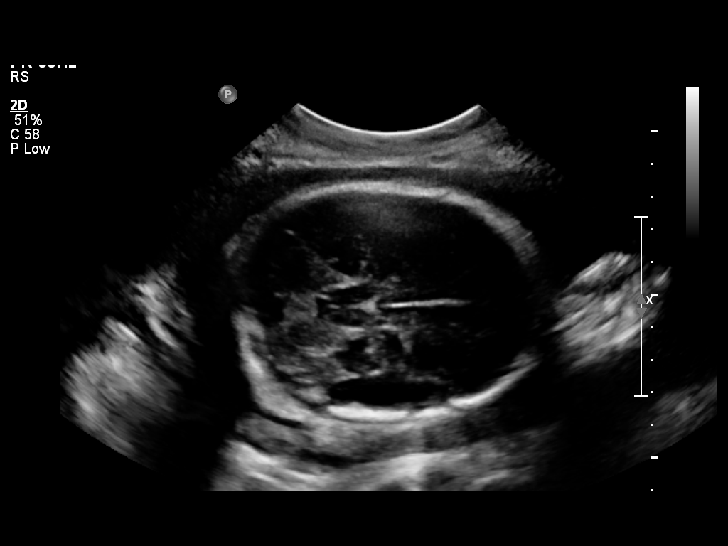
[im 21/43]
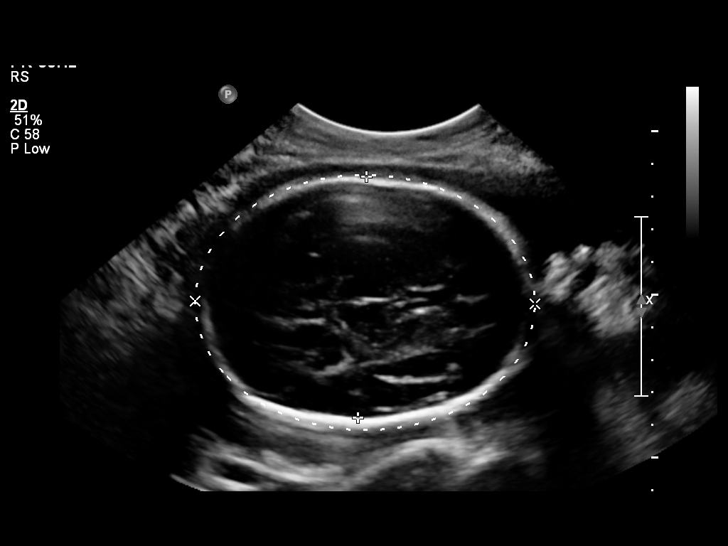
[im 24/43]
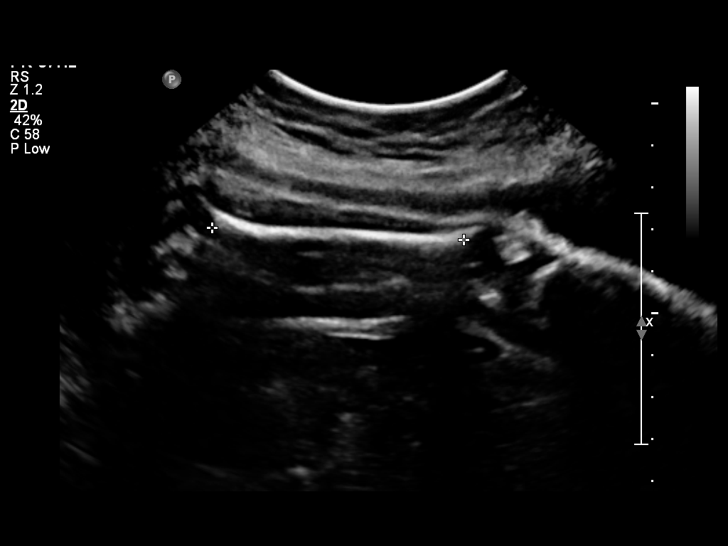
[im 27/43]
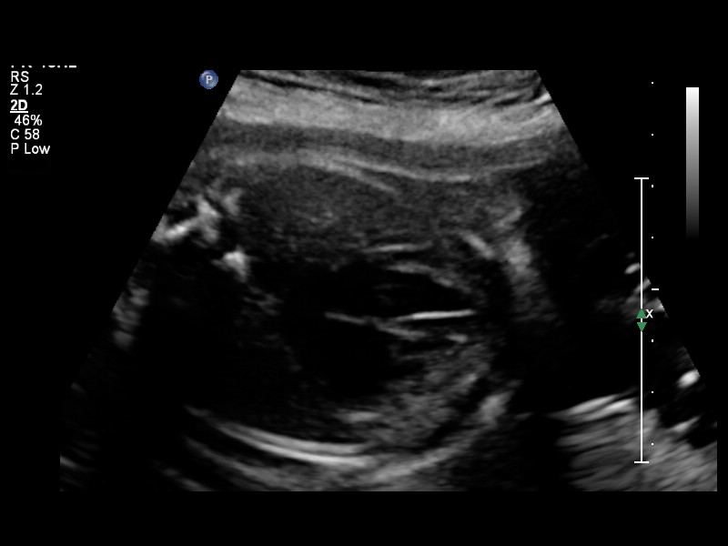
[im 30/43]
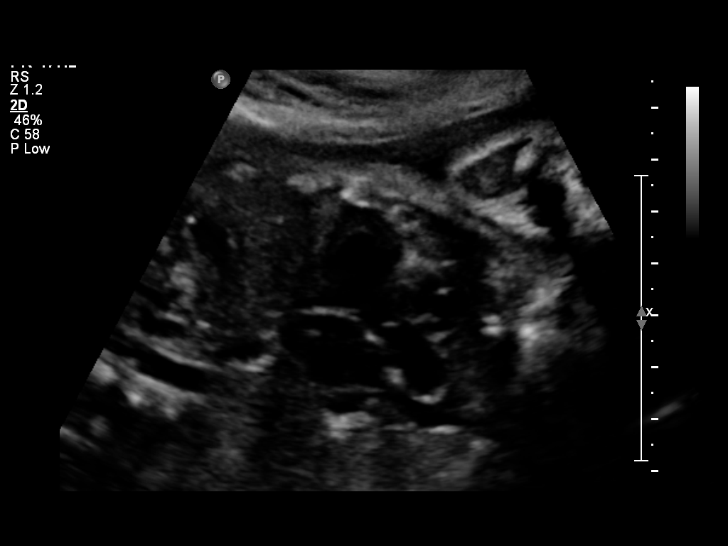
[im 33/43]
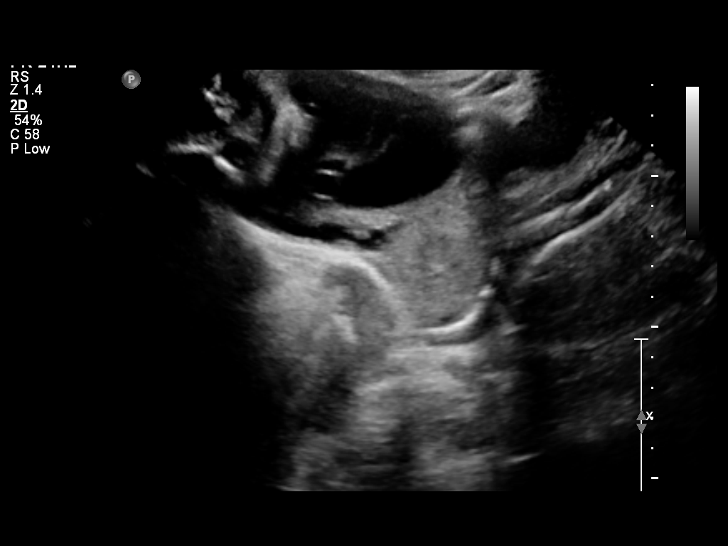
[im 36/43]
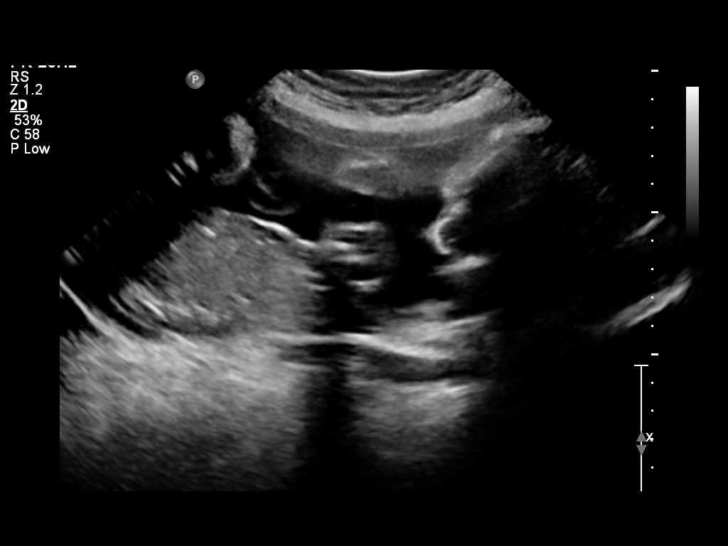
[im 39/43]
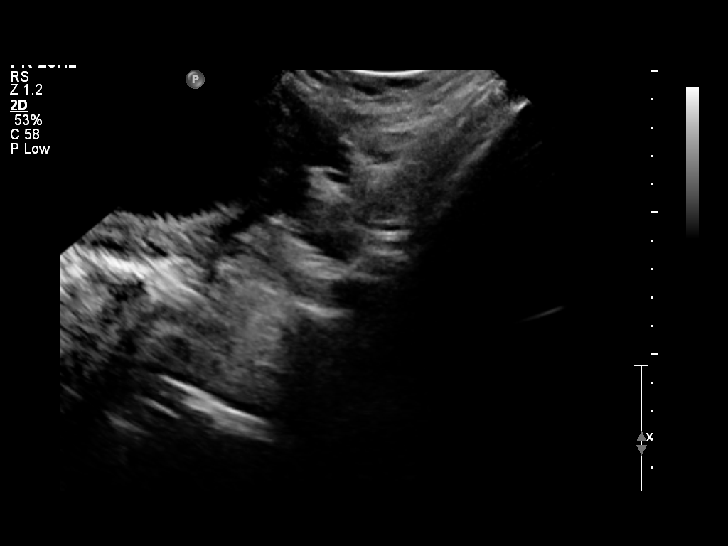
[im 43/43]
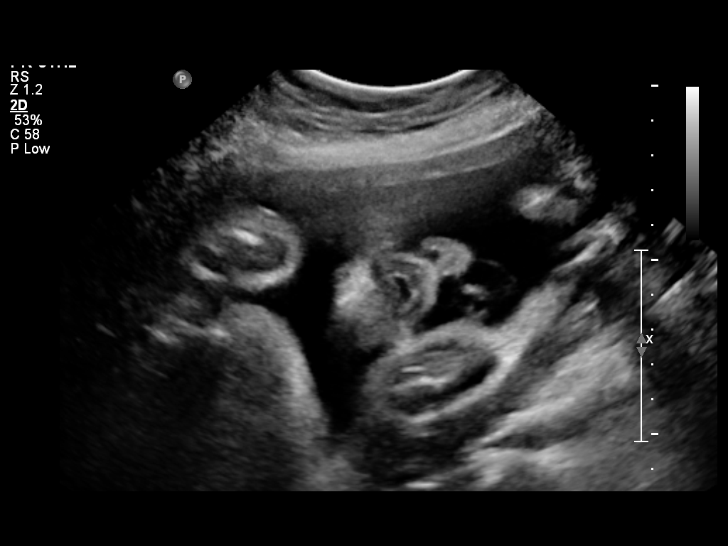

[14 of 28 positions shown; findings below may reference images not displayed]

IMPRESSION: See AS Obstetric US report.

## 2012-06-04 ENCOUNTER — Encounter (HOSPITAL_COMMUNITY): Payer: Self-pay | Admitting: Emergency Medicine

## 2012-06-04 ENCOUNTER — Emergency Department (HOSPITAL_COMMUNITY)
Admission: EM | Admit: 2012-06-04 | Discharge: 2012-06-04 | Disposition: A | Discharge status: Home / Self Care | Payer: Self-pay | Attending: Emergency Medicine | Admitting: Emergency Medicine

## 2012-06-04 DIAGNOSIS — K0889 Other specified disorders of teeth and supporting structures: Secondary | ICD-10-CM

## 2012-06-04 DIAGNOSIS — R509 Fever, unspecified: Secondary | ICD-10-CM | POA: Insufficient documentation

## 2012-06-04 DIAGNOSIS — F17200 Nicotine dependence, unspecified, uncomplicated: Secondary | ICD-10-CM | POA: Insufficient documentation

## 2012-06-04 DIAGNOSIS — K089 Disorder of teeth and supporting structures, unspecified: Secondary | ICD-10-CM | POA: Insufficient documentation

## 2012-06-04 DIAGNOSIS — K047 Periapical abscess without sinus: Secondary | ICD-10-CM | POA: Insufficient documentation

## 2012-06-04 MED ORDER — PENICILLIN V POTASSIUM 500 MG PO TABS: 500.0000 mg | ORAL_TABLET | Freq: Four times a day (QID) | ORAL | Status: DC

## 2012-06-04 MED ORDER — OXYCODONE-ACETAMINOPHEN 5-325 MG PO TABS: ORAL_TABLET | ORAL | Status: DC

## 2012-06-04 MED ORDER — BUPIVACAINE-EPINEPHRINE PF 0.5-1:200000 % IJ SOLN
1.8000 mL | Freq: Once | INTRAMUSCULAR | Status: AC
  Administered 2012-06-04: 9 mg
  Filled 2012-06-04: qty 1.8

## 2012-06-04 NOTE — ED Provider Notes (Signed)
Medical screening examination/treatment/procedure(s) were performed by non-physician practitioner and as supervising physician I was immediately available for consultation/collaboration.    Tykira Wachs R Sonam Huelsmann, MD 06/04/12 1627 

## 2012-06-04 NOTE — ED Notes (Signed)
Pt presenting to ed with c/o right side lower toothache pain that's worse today. Pt states she presented to the walk in clinic and they told her to come to the ED

## 2012-06-04 NOTE — ED Provider Notes (Signed)
History     CSN: [id number]  Arrival date & time 06/04/12  0911   First MD Initiated Contact with Patient 06/04/12 [phone]      Chief Complaint  Patient presents with  . Dental Pain    (Consider location/radiation/quality/duration/timing/severity/associated sxs/prior treatment) HPI  Angela Vega is a 32 y.o. female complaining of right h worsening over the course of 4 days associated with swelling and subjective fever and chills. Patient denies odynophagia, difficulty controlling her secretions, shortness of breath, nausea vomiting, change in vision. She has had issues with this tooth in the past.  History reviewed. No pertinent past medical history.  Past Surgical History  Procedure Laterality Date  . Cesarean section      No family history on file.  History  Substance Use Topics  . Smoking status: Current Every Day Smoker -- 0.50 packs/day    Types: Cigarettes  . Smokeless tobacco: Not on file  . Alcohol Use: No    OB History   Grav Para Term Preterm Abortions TAB SAB Ect Mult Living                  Review of Systems  Constitutional: Negative for fever.  HENT: Positive for dental problem.   Respiratory: Negative for shortness of breath.   Cardiovascular: Negative for chest pain.  Gastrointestinal: Negative for nausea, vomiting, abdominal pain and diarrhea.  All other systems reviewed and are negative.    Allergies  Review of patient's allergies indicates no known allergies.  Home Medications  No current outpatient prescriptions on file.  BP 131/73  Pulse 80  Temp(Src) 98.4 F (36.9 C) (Oral)  SpO2 99%  LMP 06/01/2012  Physical Exam  Nursing note and vitals reviewed. Constitutional: She is oriented to person, place, and time. She appears well-developed and well-nourished. No distress.  HENT:  Head: Normocephalic.  Mouth/Throat:    Generally poor dentition, extensive caries to tooth #31. Patient has mild swelling and erythema to right  pupil mucosa and gum line.  Eyes: Conjunctivae and EOM are normal.  Cardiovascular: Normal rate.   Pulmonary/Chest: Effort normal. No stridor.  Musculoskeletal: Normal range of motion.  Lymphadenopathy:    She has cervical adenopathy.  Neurological: She is alert and oriented to person, place, and time.  Psychiatric: She has a normal mood and affect.    ED Course  NERVE BLOCK Date/Time: 06/04/2012 10:10 AM Performed by: Wynetta Emery Authorized by: Wynetta Emery Consent: Verbal consent obtained. Consent given by: patient Patient identity confirmed: arm band Indications: pain relief Laterality: right Patient position: sitting Local anesthetic: bupivacaine 0.5% with epinephrine Outcome: pain improved Patient tolerance: Patient tolerated the procedure well with no immediate complications.   (including critical care time)  Labs Reviewed - No data to display No results found.   1. Pain, dental   2. Dental abscess       MDM     Filed Vitals:   06/04/12 0919  BP: 131/73  Pulse: 80  Temp: 98.4 F (36.9 C)  TempSrc: Oral  SpO2: 99%     Pt verbalized understanding and agrees with care plan. Outpatient follow-up and return precautions given.    Discharge Medication List as of 06/04/2012  9:48 AM    START taking these medications   Details  oxyCODONE-acetaminophen (PERCOCET/ROXICET) 5-325 MG per tablet 1 to 2 tabs PO q6hrs  PRN for pain, Print    penicillin v potassium (VEETID) 500 MG tablet Take 1 tablet (500 mg total) by mouth 4 (four)  times daily., Starting 06/04/2012, Until Discontinued, [employer], PA-C 06/04/12 1012

## 2012-08-12 ENCOUNTER — Emergency Department (HOSPITAL_COMMUNITY)
Admission: EM | Admit: 2012-08-12 | Discharge: 2012-08-12 | Disposition: A | Discharge status: Home / Self Care | Payer: Self-pay | Attending: Emergency Medicine | Admitting: Emergency Medicine

## 2012-08-12 ENCOUNTER — Emergency Department (HOSPITAL_COMMUNITY): Payer: Self-pay

## 2012-08-12 ENCOUNTER — Encounter (HOSPITAL_COMMUNITY): Payer: Self-pay | Admitting: *Deleted

## 2012-08-12 DIAGNOSIS — M25532 Pain in left wrist: Secondary | ICD-10-CM

## 2012-08-12 DIAGNOSIS — M25539 Pain in unspecified wrist: Secondary | ICD-10-CM | POA: Insufficient documentation

## 2012-08-12 DIAGNOSIS — R52 Pain, unspecified: Secondary | ICD-10-CM | POA: Insufficient documentation

## 2012-08-12 DIAGNOSIS — F17200 Nicotine dependence, unspecified, uncomplicated: Secondary | ICD-10-CM | POA: Insufficient documentation

## 2012-08-12 MED ORDER — IBUPROFEN 800 MG PO TABS: 800.0000 mg | ORAL_TABLET | Freq: Three times a day (TID) | ORAL | Status: DC

## 2012-08-12 MED ORDER — HYDROCODONE-ACETAMINOPHEN 5-325 MG PO TABS: 1.0000 | ORAL_TABLET | ORAL | Status: DC | PRN

## 2012-08-12 NOTE — ED Notes (Signed)
Pt from home with reports of injuring L wrist after pushing self up from couch yesterday evening. Small amount of swelling noted to L wrist. Pt reports that she is unable to move wrist.

## 2012-08-12 NOTE — ED Provider Notes (Signed)
History     CSN: [id number]  Arrival date & time 08/12/12  6213   First MD Initiated Contact with Patient 08/12/12 [phone]      Chief Complaint  Patient presents with  . Wrist Pain    (Consider location/radiation/quality/duration/timing/severity/associated sxs/prior treatment) HPI Comments: Patient is a 32 year old female with no significant past medical history who presents for left wrist pain with onset yesterday. Patient states that she was pushing herself up from the couch when she experienced a sharp pain sensation along the lateral aspect of her wrist. Patient states that pain is worse with palpation and with movement it is nonradiating. Patient has tried ice without relief of symptoms. She admits to associated swelling and denies any fever, redness, pallor, or numbness or tingling.  Patient is a 32 y.o. female presenting with wrist pain. The history is provided by the patient. No language interpreter was used.  Wrist Pain Associated symptoms include arthralgias and joint swelling. Pertinent negatives include no fever, numbness or weakness.    History reviewed. No pertinent past medical history.  Past Surgical History  Procedure Laterality Date  . Cesarean section      History reviewed. No pertinent family history.  History  Substance Use Topics  . Smoking status: Current Every Day Smoker -- 0.50 packs/day    Types: Cigarettes  . Smokeless tobacco: Never Used  . Alcohol Use: No    OB History   Grav Para Term Preterm Abortions TAB SAB Ect Mult Living                  Review of Systems  Constitutional: Negative for fever.  Musculoskeletal: Positive for joint swelling and arthralgias.  Skin: Negative for color change.  Neurological: Negative for weakness and numbness.  All other systems reviewed and are negative.    Allergies  Review of patient's allergies indicates no known allergies.  Home Medications   Current Outpatient Rx  Name  Route  Sig  Dispense   Refill  . HYDROcodone-acetaminophen (NORCO/VICODIN) 5-325 MG per tablet   Oral   Take 1 tablet by mouth every 4 (four) hours as needed for pain.   10 tablet   0   . ibuprofen (ADVIL,MOTRIN) 800 MG tablet   Oral   Take 1 tablet (800 mg total) by mouth 3 (three) times daily.   21 tablet   0     BP 121/85  Pulse 90  Temp(Src) 98.5 F (36.9 C) (Oral)  Resp 18  Wt 154 lb (69.854 kg)  SpO2 98%  LMP 07/22/2012  Physical Exam  Nursing note and vitals reviewed. Constitutional: She is oriented to person, place, and time. She appears well-developed and well-nourished. No distress.  HENT:  Head: Normocephalic and atraumatic.  Eyes: Conjunctivae are normal. No scleral icterus.  Neck: Normal range of motion.  Cardiovascular: Normal rate, regular rhythm and intact distal pulses.   Pulmonary/Chest: Effort normal. No respiratory distress.  Musculoskeletal:  Tenderness to palpation of the L ulnar styloid as well as the lateral aspect of the left hand. There is limited range of motion on flexion, extension, abduction, and abduction of the left wrist secondary to discomfort. Patient is able to move all of her fingers and make a tight fist. Mild joint swelling appreciated around the ulnar styloid without erythema or heat to touch.  Neurological: She is alert and oriented to person, place, and time.  No sensory or motor deficits appreciated  Skin: Skin is warm and dry. She is not diaphoretic.  Psychiatric: She has a normal mood and affect. Her behavior is normal.    ED Course  Procedures (including critical care time)  Labs Reviewed - No data to display Dg Wrist Complete Left  08/12/2012  *RADIOLOGY REPORT*  Clinical Data: Left wrist pain.  LEFT WRIST - COMPLETE 3+ VIEW  Comparison: 03/06/2006  Findings: The left wrist is located without acute fracture.  No significant soft tissue swelling.  The distal radioulnar joint appears to be mildly wide compared to the previous examination but not  grossly abnormal.  IMPRESSION: No acute bony abnormality to the left wrist.  Question mild widening of the distal radioulnar joint which could be related the patient positioning.  If there is concern for a ligamentous injury, consider further evaluation with MRI.   Original Report Authenticated By: Richarda Overlie, M.D.      1. Wrist pain, acute, left      MDM  Patient is a 32 year old female with no significant past medical history who presents for left wrist pain with onset yesterday. On physical exam patient is neurovascularly intact with tenderness to palpation of the ulnar styloid with mild swelling without erythema or heat to touch. There is limited range of motion secondary to discomfort. X-ray of the left wrist without evidence of fracture or dislocation; question of ligament injury given widening of the distal radioulnar joint. Believe patient is stable for discharge with wrist splint, rice instruction and orthopedic followup. Ibuprofen recommended for inflammation and Norco provided for severe pain. Indications for ED return discussed. Patient states comfort and understanding with this discharge plan with no unaddressed concerns.        Antony Madura, PA-C 08/12/12 1134

## 2012-08-13 NOTE — ED Provider Notes (Signed)
Medical screening examination/treatment/procedure(s) were performed by non-physician practitioner and as supervising physician I was immediately available for consultation/collaboration.    Vida Roller, MD 08/13/12 1328

## 2013-07-07 ENCOUNTER — Encounter (HOSPITAL_COMMUNITY): Payer: Self-pay | Admitting: Emergency Medicine

## 2013-07-07 ENCOUNTER — Emergency Department (HOSPITAL_COMMUNITY)
Admission: EM | Admit: 2013-07-07 | Discharge: 2013-07-07 | Disposition: A | Discharge status: Home / Self Care | Payer: Self-pay | Attending: Emergency Medicine | Admitting: Emergency Medicine

## 2013-07-07 DIAGNOSIS — A071 Giardiasis [lambliasis]: Secondary | ICD-10-CM | POA: Insufficient documentation

## 2013-07-07 DIAGNOSIS — Z79899 Other long term (current) drug therapy: Secondary | ICD-10-CM | POA: Insufficient documentation

## 2013-07-07 DIAGNOSIS — F17200 Nicotine dependence, unspecified, uncomplicated: Secondary | ICD-10-CM | POA: Insufficient documentation

## 2013-07-07 DIAGNOSIS — R197 Diarrhea, unspecified: Secondary | ICD-10-CM

## 2013-07-07 LAB — COMPREHENSIVE METABOLIC PANEL
ALK PHOS: 56 U/L (ref 39–117)
ALT: 9 U/L (ref 0–35)
AST: 12 U/L (ref 0–37)
Albumin: 3.5 g/dL (ref 3.5–5.2)
BILIRUBIN TOTAL: 0.2 mg/dL — AB (ref 0.3–1.2)
BUN: 9 mg/dL (ref 6–23)
CHLORIDE: 102 meq/L (ref 96–112)
CO2: 24 meq/L (ref 19–32)
Calcium: 9 mg/dL (ref 8.4–10.5)
Creatinine, Ser: 0.49 mg/dL — ABNORMAL LOW (ref 0.50–1.10)
GLUCOSE: 92 mg/dL (ref 70–99)
POTASSIUM: 3.7 meq/L (ref 3.7–5.3)
Sodium: 137 mEq/L (ref 137–147)
TOTAL PROTEIN: 6.7 g/dL (ref 6.0–8.3)

## 2013-07-07 LAB — CBC WITH DIFFERENTIAL/PLATELET
Basophils Absolute: 0 10*3/uL (ref 0.0–0.1)
Basophils Relative: 0 % (ref 0–1)
EOS ABS: 0.2 10*3/uL (ref 0.0–0.7)
Eosinophils Relative: 2 % (ref 0–5)
HCT: 36.5 % (ref 36.0–46.0)
HEMOGLOBIN: 11.8 g/dL — AB (ref 12.0–15.0)
LYMPHS ABS: 1.9 10*3/uL (ref 0.7–4.0)
Lymphocytes Relative: 18 % (ref 12–46)
MCH: 24.6 pg — AB (ref 26.0–34.0)
MCHC: 32.3 g/dL (ref 30.0–36.0)
MCV: 76 fL — ABNORMAL LOW (ref 78.0–100.0)
MONOS PCT: 5 % (ref 3–12)
Monocytes Absolute: 0.6 10*3/uL (ref 0.1–1.0)
NEUTROS PCT: 75 % (ref 43–77)
Neutro Abs: 8.3 10*3/uL — ABNORMAL HIGH (ref 1.7–7.7)
Platelets: 285 10*3/uL (ref 150–400)
RBC: 4.8 MIL/uL (ref 3.87–5.11)
RDW: 15.3 % (ref 11.5–15.5)
WBC: 11.1 10*3/uL — ABNORMAL HIGH (ref 4.0–10.5)

## 2013-07-07 LAB — LIPASE, BLOOD: LIPASE: 26 U/L (ref 11–59)

## 2013-07-07 MED ORDER — DIPHENOXYLATE-ATROPINE 2.5-0.025 MG PO TABS: 1.0000 | ORAL_TABLET | Freq: Four times a day (QID) | ORAL | Status: AC | PRN

## 2013-07-07 MED ORDER — ONDANSETRON 4 MG PO TBDP
4.0000 mg | ORAL_TABLET | Freq: Once | ORAL | Status: AC
  Administered 2013-07-07: 4 mg via ORAL
  Filled 2013-07-07: qty 1

## 2013-07-07 MED ORDER — METRONIDAZOLE 500 MG PO TABS: 500.0000 mg | ORAL_TABLET | Freq: Three times a day (TID) | ORAL | Status: AC

## 2013-07-07 MED ORDER — ONDANSETRON 4 MG PO TBDP: 4.0000 mg | ORAL_TABLET | Freq: Three times a day (TID) | ORAL | Status: AC | PRN

## 2013-07-07 MED ORDER — DIPHENOXYLATE-ATROPINE 2.5-0.025 MG PO TABS
2.0000 | ORAL_TABLET | Freq: Once | ORAL | Status: AC
  Administered 2013-07-07: 2 via ORAL
  Filled 2013-07-07: qty 2

## 2013-07-07 NOTE — Progress Notes (Signed)
[facility] Stacy, provided pt with a list of primary care resources. Patient stated that she was pending insurance through job.

## 2013-07-07 NOTE — ED Provider Notes (Signed)
CSN: [id number]     Arrival date & time 07/07/13  [id number] History   First MD Initiated Contact with Patient 07/07/13 [phone]     Chief Complaint  Patient presents with  . Abdominal Pain  . Diarrhea  . Emesis  . foul burps      HPI  Pt states that for years "eggs make my burps smell like farts". For the last 2 months, she has had daily diarrhea.  No blood, but watery,foamy, and foul smelling.  No fever.  No abdominal pain.  No RUQ pain.  [place] travel[place]  [place] friend [place] farm [place], where they [place] water.  No animal exposures.  No ill contacts.  No recent antibiotic use.  History reviewed. No pertinent past medical history. Past Surgical History  Procedure Laterality Date  . Cesarean section     History reviewed. No pertinent family history. History  Substance Use Topics  . Smoking status: Current Every Day Smoker -- 0.50 packs/day    Types: Cigarettes  . Smokeless tobacco: Never Used  . Alcohol Use: No   OB History   Grav Para Term Preterm Abortions TAB SAB Ect Mult Living                 Review of Systems  Constitutional: Negative for fever, chills, diaphoresis, appetite change and fatigue.  HENT: Negative for mouth sores, sore throat and trouble swallowing.   Eyes: Negative for visual disturbance.  Respiratory: Negative for cough, chest tightness, shortness of breath and wheezing.   Cardiovascular: Negative for chest pain.  Gastrointestinal: Positive for nausea, diarrhea and abdominal distention. Negative for vomiting and abdominal pain.       Watery, foamy diarrhea for 2 months.  No blood.  "Looks like it has leaves in it".  Endocrine: Negative for polydipsia, polyphagia and polyuria.  Genitourinary: Negative for dysuria, frequency and hematuria.  Musculoskeletal: Negative for gait problem.  Skin: Negative for color change, pallor and rash.  Neurological: Negative for dizziness, syncope, light-headedness and headaches.   Hematological: Does not bruise/bleed easily.  Psychiatric/Behavioral: Negative for behavioral problems and confusion.      Allergies  Review of patient's allergies indicates no known allergies.  Home Medications   Current Outpatient Rx  Name  Route  Sig  Dispense  Refill  . diphenoxylate-atropine (LOMOTIL) 2.5-0.025 MG per tablet   Oral   Take 1 tablet by mouth 4 (four) times daily as needed for diarrhea or loose stools.   30 tablet   0   . metroNIDAZOLE (FLAGYL) 500 MG tablet   Oral   Take 1 tablet (500 mg total) by mouth 3 (three) times daily.   14 tablet   0   . ondansetron (ZOFRAN ODT) 4 MG disintegrating tablet   Oral   Take 1 tablet (4 mg total) by mouth every 8 (eight) hours as needed for nausea.   20 tablet   0    BP 103/76  Pulse 76  Temp(Src) 98.2 F (36.8 C) (Oral)  Resp 16  SpO2 96%  LMP 06/16/2013 Physical Exam  Constitutional: She is oriented to person, place, and time. She appears well-developed and well-nourished. No distress.  HENT:  Head: Normocephalic.  Eyes: Conjunctivae are normal. Pupils are equal, round, and reactive to light. No scleral icterus.  Neck: Normal range of motion. Neck supple. No thyromegaly present.  Cardiovascular: Normal rate and regular rhythm.  Exam reveals no gallop and no friction rub.  No murmur heard. Pulmonary/Chest: Effort normal and breath sounds normal. No respiratory distress. She has no wheezes. She has no rales.  Abdominal: Soft. Bowel sounds are normal. She exhibits no distension. There is no tenderness. There is no rebound.  Slight hyperactive bowel sounds. Non tender.    Musculoskeletal: Normal range of motion.  Neurological: She is alert and oriented to person, place, and time.  Skin: Skin is warm and dry. No rash noted.  Psychiatric: She has a normal mood and affect. Her behavior is normal.    ED Course  Procedures (including critical care time) Labs Review Labs Reviewed  CBC WITH DIFFERENTIAL -  Abnormal; Notable for the following:    WBC 11.1 (*)    Hemoglobin 11.8 (*)    MCV 76.0 (*)    MCH 24.6 (*)    Neutro Abs 8.3 (*)    All other components within normal limits  COMPREHENSIVE METABOLIC PANEL - Abnormal; Notable for the following:    Creatinine, Ser 0.49 (*)    Total Bilirubin 0.2 (*)    All other components within normal limits  LIPASE, BLOOD   Imaging Review No results found.   EKG Interpretation None      MDM   Final diagnoses:  Diarrhea  Giardia    Labs are reassuring. WBC 11.1. Her symptoms are consistent with Giardia. Plan will be empiric treatment.   Also Lomotil and Zofran for symptoms.    Rolland Porter, MD 07/07/13 1102

## 2013-07-07 NOTE — Discharge Instructions (Signed)
Your symptoms are consistent with an intestinal infection from Giardia (See information below).  Diarrhea Diarrhea is frequent loose and watery bowel movements. It can cause you to feel weak and dehydrated. Dehydration can cause you to become tired and thirsty, have a dry mouth, and have decreased urination that often is dark yellow. Diarrhea is a sign of another problem, most often an infection that will not last long. In most cases, diarrhea typically lasts 2 3 days. However, it can last longer if it is a sign of something more serious. It is important to treat your diarrhea as directed by your caregive to lessen or prevent future episodes of diarrhea. CAUSES  Some common causes include:  Gastrointestinal infections caused by viruses, bacteria, or parasites.  Food poisoning or food allergies.  Certain medicines, such as antibiotics, chemotherapy, and laxatives.  Artificial sweeteners and fructose.  Digestive disorders. HOME CARE INSTRUCTIONS  Ensure adequate fluid intake (hydration): have 1 cup (8 oz) of fluid for each diarrhea episode. Avoid fluids that contain simple sugars or sports drinks, fruit juices, whole milk products, and sodas. Your urine should be clear or pale yellow if you are drinking enough fluids. Hydrate with an oral rehydration solution that you can purchase at pharmacies, retail stores, and online. You can prepare an oral rehydration solution at home by mixing the following ingredients together:    tsp table salt.   tsp baking soda.   tsp salt substitute containing potassium chloride.  1  tablespoons sugar.  1 L (34 oz) of water.  Certain foods and beverages may increase the speed at which food moves through the gastrointestinal (GI) tract. These foods and beverages should be avoided and include:  Caffeinated and alcoholic beverages.  High-fiber foods, such as raw fruits and vegetables, nuts, seeds, and whole grain breads and cereals.  Foods and beverages  sweetened with sugar alcohols, such as xylitol, sorbitol, and mannitol.  Some foods may be well tolerated and may help thicken stool including:  Starchy foods, such as rice, toast, pasta, low-sugar cereal, oatmeal, grits, baked potatoes, crackers, and bagels.  Bananas.  Applesauce.  Add probiotic-rich foods to help increase healthy bacteria in the GI tract, such as yogurt and fermented milk products.  Wash your hands well after each diarrhea episode.  Only take over-the-counter or prescription medicines as directed by your caregiver.  Take a warm bath to relieve any burning or pain from frequent diarrhea episodes. SEEK IMMEDIATE MEDICAL CARE IF:   You are unable to keep fluids down.  You have persistent vomiting.  You have blood in your stool, or your stools are black and tarry.  You do not urinate in 6 8 hours, or there is only a small amount of very dark urine.  You have abdominal pain that increases or localizes.  You have weakness, dizziness, confusion, or lightheadedness.  You have a severe headache.  Your diarrhea gets worse or does not get better.  You have a fever or persistent symptoms for more than 2 3 days.  You have a fever and your symptoms suddenly get worse. MAKE SURE YOU:   Understand these instructions.  Will watch your condition.  Will get help right away if you are not doing well or get worse. Document Released: 03/24/2002 Document Revised: 03/20/2012 Document Reviewed: 12/10/2011 [facility] Patient Information 2014 [place], [place].  Giardiasis Giardiasis is an infection of the small intestine with the parasite Giardia intestinalis. Giardia intestinalis cannot be seen with the naked eye. It is often found in  unclean (contaminated) water.  CAUSES  Infection can be caused by drinking contaminated water. Giardia intestinalis can also be found in some tap water. SYMPTOMS  An infection causes:  Explosive, foul smelling, watery diarrhea.  A Feeling  of sickness in your stomach (nausea).  Abdominal cramps and pain. It takes about 1 to 2 weeks after ingesting infected water or food to get sick. The illness usually lasts 2 to 4 weeks. Infection in infants and children can be long lasting. DIAGNOSIS  It can be diagnosed by stool exam. Blood tests may be needed. TREATMENT  Medications can be given to shorten the course of the illness. HOME CARE INSTRUCTIONS   In areas of contamination, boil your water if possible. Filtering tap water in areas of contamination removes most Giardia. Cysts of Giardia Intestinalis are resistant to chlorine.  Be careful handling soiled undergarments and diapers. If infection is present, it is easily passed by hand to mouth. Use good hand-washing techniques.  Follow up with your caregiver as directed. SEEK MEDICAL CARE IF:  You do not get better. Document Released: 03/31/2000 Document Revised: 06/26/2011 Document Reviewed: 11/21/2007 [facility] Patient Information 2014 [place].

## 2013-07-07 NOTE — ED Notes (Signed)
Pt states that for past couple months she would have burps that smell like BM but usually only after eating eggs. But now after eating anything as well as abd pain with diarrhea and vomiting. Pt states that she is constantly nauseated.
